# Patient Record
Sex: Female | Born: 1990 | Race: White | Hispanic: No | Marital: Married | Smoking: Former smoker
Health system: Southern US, Community
[De-identification: ages and names within clinical notes are randomized; demographics above are authoritative.]

## PROBLEM LIST (undated history)

## (undated) ENCOUNTER — Ambulatory Visit: Admission: EM | Payer: No Typology Code available for payment source | Source: Home / Self Care

## (undated) DIAGNOSIS — J45909 Unspecified asthma, uncomplicated: Secondary | ICD-10-CM

## (undated) DIAGNOSIS — G43909 Migraine, unspecified, not intractable, without status migrainosus: Secondary | ICD-10-CM

---
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
## 2011-06-23 LAB — POCT PREGNANCY, URINE

## 2011-09-25 ENCOUNTER — Emergency Department (INDEPENDENT_AMBULATORY_CARE_PROVIDER_SITE_OTHER)
Admission: EM | Admit: 2011-09-25 | Discharge: 2011-09-25 | Disposition: A | Discharge status: Home / Self Care | Payer: Worker's Compensation | Source: Home / Self Care | Attending: Emergency Medicine | Admitting: Emergency Medicine

## 2011-09-25 ENCOUNTER — Encounter: Payer: Self-pay | Admitting: Emergency Medicine

## 2011-09-25 DIAGNOSIS — S51809A Unspecified open wound of unspecified forearm, initial encounter: Secondary | ICD-10-CM

## 2011-09-25 DIAGNOSIS — S51819A Laceration without foreign body of unspecified forearm, initial encounter: Secondary | ICD-10-CM

## 2011-09-25 MED ORDER — IBUPROFEN 400 MG PO TABS: 400.0000 mg | ORAL_TABLET | Freq: Four times a day (QID) | ORAL | Status: AC | PRN

## 2011-09-25 NOTE — ED Notes (Signed)
Laceration to right forearm.  Injury occurred at work

## 2011-09-25 NOTE — ED Provider Notes (Addendum)
History     CSN: [id number]  Arrival date & time 09/25/11  1715   First MD Initiated Contact with Patient 09/25/11 1718      Chief Complaint  Patient presents with  . Laceration    (Consider location/radiation/quality/duration/timing/severity/associated sxs/prior treatment) Patient is a 21 y.o. female presenting with skin laceration. The history is provided by the patient.  Laceration  The incident occurred 1 to 2 hours ago. The laceration is located on the right arm. The laceration is 1 cm in size. The pain is at a severity of 5/10. The pain is moderate. The pain has been constant since onset. Her tetanus status is UTD.    History reviewed. No pertinent past medical history.  History reviewed. No pertinent past surgical history.  History reviewed. No pertinent family history.  History  Substance Use Topics  . Smoking status: Never Smoker   . Smokeless tobacco: Not on file  . Alcohol Use: No    OB History    Grav Para Term Preterm Abortions TAB SAB Ect Mult Living                  Review of Systems  Constitutional: Negative for activity change.  Musculoskeletal: Negative for joint swelling.    Allergies  Review of patient's allergies indicates no known allergies.  Home Medications   Current Outpatient Rx  Name Route Sig Dispense Refill  . IBUPROFEN 400 MG PO TABS Oral Take 1 tablet (400 mg total) by mouth every 6 (six) hours as needed for pain. 30 tablet 0    BP 130/86  Pulse 74  Temp(Src) 98.3 F (36.8 C) (Oral)  Resp 14  SpO2 97%  LMP 08/21/2011  Physical Exam  Constitutional: She appears well-developed and well-nourished.  Neurological: She is alert. She has normal strength. No sensory deficit.  Skin:       ED Course  LACERATION REPAIR Performed by: Hazelee Harbold Authorized by: Jimmie Molly Consent: Written consent obtained. Consent given by: patient Patient understanding: patient states understanding of the procedure being performed Patient  identity confirmed: verbally with patient Tendon involvement: none Nerve involvement: none Vascular damage: no Anesthesia: local infiltration Patient tolerance: Patient tolerated the procedure well with no immediate complications. Comments: approximated edges- and use derma bond-   (including critical care time)  Labs Reviewed - No data to display No results found.   1. Laceration of forearm       MDM  1.9 cm linear  laceration- superficial laceration        Jimmie Molly, MD 09/25/11 2016  Jimmie Molly, MD 09/25/11 2018

## 2011-09-25 NOTE — ED Notes (Addendum)
Applied steri strips

## 2011-11-07 ENCOUNTER — Encounter (HOSPITAL_COMMUNITY): Payer: Self-pay | Admitting: *Deleted

## 2011-11-07 ENCOUNTER — Emergency Department (HOSPITAL_COMMUNITY)
Admission: EM | Admit: 2011-11-07 | Discharge: 2011-11-07 | Disposition: A | Discharge status: Home / Self Care | Payer: Self-pay | Attending: Emergency Medicine | Admitting: Emergency Medicine

## 2011-11-07 DIAGNOSIS — R112 Nausea with vomiting, unspecified: Secondary | ICD-10-CM | POA: Insufficient documentation

## 2011-11-07 DIAGNOSIS — R109 Unspecified abdominal pain: Secondary | ICD-10-CM | POA: Insufficient documentation

## 2011-11-07 DIAGNOSIS — R319 Hematuria, unspecified: Secondary | ICD-10-CM | POA: Insufficient documentation

## 2011-11-07 DIAGNOSIS — R10819 Abdominal tenderness, unspecified site: Secondary | ICD-10-CM | POA: Insufficient documentation

## 2011-11-07 DIAGNOSIS — R6883 Chills (without fever): Secondary | ICD-10-CM | POA: Insufficient documentation

## 2011-11-07 DIAGNOSIS — R300 Dysuria: Secondary | ICD-10-CM | POA: Insufficient documentation

## 2011-11-07 DIAGNOSIS — N10 Acute tubulo-interstitial nephritis: Secondary | ICD-10-CM | POA: Insufficient documentation

## 2011-11-07 LAB — CBC
MCH: 29.2 pg (ref 26.0–34.0)
MCHC: 35.2 g/dL (ref 30.0–36.0)
Platelets: 144 10*3/uL — ABNORMAL LOW (ref 150–400)
RDW: 12 % (ref 11.5–15.5)

## 2011-11-07 LAB — URINALYSIS, ROUTINE W REFLEX MICROSCOPIC
Glucose, UA: NEGATIVE mg/dL
Ketones, ur: 40 mg/dL — AB
Nitrite: NEGATIVE
Protein, ur: 30 mg/dL — AB

## 2011-11-07 LAB — POCT I-STAT, CHEM 8
Chloride: 103 mEq/L (ref 96–112)
Glucose, Bld: 100 mg/dL — ABNORMAL HIGH (ref 70–99)
HCT: 38 % (ref 36.0–46.0)
Hemoglobin: 12.9 g/dL (ref 12.0–15.0)
Potassium: 4.1 mEq/L (ref 3.5–5.1)
Sodium: 138 mEq/L (ref 135–145)

## 2011-11-07 LAB — PREGNANCY, URINE: Preg Test, Ur: NEGATIVE

## 2011-11-07 LAB — DIFFERENTIAL
Basophils Absolute: 0 10*3/uL (ref 0.0–0.1)
Basophils Relative: 0 % (ref 0–1)
Eosinophils Absolute: 0 10*3/uL (ref 0.0–0.7)
Neutro Abs: 7.7 10*3/uL (ref 1.7–7.7)
Neutrophils Relative %: 75 % (ref 43–77)

## 2011-11-07 LAB — URINE MICROSCOPIC-ADD ON

## 2011-11-07 MED ORDER — SODIUM CHLORIDE 0.9 % IV BOLUS (SEPSIS)
1000.0000 mL | Freq: Once | INTRAVENOUS | Status: AC
  Administered 2011-11-07: 1000 mL via INTRAVENOUS

## 2011-11-07 MED ORDER — HYDROCODONE-ACETAMINOPHEN 5-500 MG PO TABS: 1.0000 | ORAL_TABLET | Freq: Four times a day (QID) | ORAL | Status: AC | PRN

## 2011-11-07 MED ORDER — KETOROLAC TROMETHAMINE 30 MG/ML IJ SOLN
30.0000 mg | Freq: Once | INTRAMUSCULAR | Status: AC
  Administered 2011-11-07: 30 mg via INTRAVENOUS
  Filled 2011-11-07: qty 1

## 2011-11-07 MED ORDER — DEXTROSE 5 % IV SOLN
1.0000 g | Freq: Once | INTRAVENOUS | Status: AC
  Administered 2011-11-07: 1 g via INTRAVENOUS
  Filled 2011-11-07: qty 10

## 2011-11-07 MED ORDER — ONDANSETRON 8 MG PO TBDP: 8.0000 mg | ORAL_TABLET | Freq: Three times a day (TID) | ORAL | Status: AC | PRN

## 2011-11-07 MED ORDER — CEPHALEXIN 500 MG PO CAPS: 1000.0000 mg | ORAL_CAPSULE | Freq: Two times a day (BID) | ORAL | Status: AC

## 2011-11-07 MED ORDER — ONDANSETRON HCL 4 MG/2ML IJ SOLN
4.0000 mg | Freq: Once | INTRAMUSCULAR | Status: AC
  Administered 2011-11-07: 4 mg via INTRAVENOUS
  Filled 2011-11-07: qty 2

## 2011-11-07 NOTE — Discharge Instructions (Signed)
I suspect your pain is due to an infection in left kidney. Make sure you get plenty of rest, drink lots of fluids. Start keflex as prescribed tonight, take it until all gone. Take zofran as prescribed for nausea as needed. Take ibuprofen for pain. Take vicodin as prescribed as needed for severe pain. Do not drive if taking. Follow up in 5 days if continue to have symptoms. Return if worsening.   Pyelonephritis, Adult Pyelonephritis is a kidney infection. In general, there are 2 main types of pyelonephritis:  Infections that come on quickly without any warning (acute pyelonephritis).   Infections that persist for a long period of time (chronic pyelonephritis).  CAUSES  Two main causes of pyelonephritis are:  Bacteria traveling from the bladder to the kidney. This is a problem especially in pregnant women. The urine in the bladder can become filled with bacteria from multiple causes, including:   Inflammation of the prostate gland (prostatitis).   Sexual intercourse in females.   Bladder infection (cystitis).   Bacteria traveling from the bloodstream to the tissue part of the kidney.  Problems that may increase your risk of getting a kidney infection include:  Diabetes.   Kidney stones or bladder stones.   Cancer.   Catheters placed in the bladder.   Other abnormalities of the kidney or ureter.  SYMPTOMS   Abdominal pain.   Pain in the side or flank area.   Fever.   Chills.   Upset stomach.   Blood in the urine (dark urine).   Frequent urination.   Strong or persistent urge to urinate.   Burning or stinging when urinating.  DIAGNOSIS  Your caregiver may diagnose your kidney infection based on your symptoms. A urine sample may also be taken. TREATMENT  In general, treatment depends on how severe the infection is.   If the infection is mild and caught early, your caregiver may treat you with oral antibiotics and send you home.   If the infection is more severe,  the bacteria may have gotten into the bloodstream. This will require intravenous (IV) antibiotics and a hospital stay. Symptoms may include:   High fever.   Severe flank pain.   Shaking chills.   Even after a hospital stay, your caregiver may require you to be on oral antibiotics for a period of time.   Other treatments may be required depending upon the cause of the infection.  HOME CARE INSTRUCTIONS   Take your antibiotics as directed. Finish them even if you start to feel better.   Make an appointment to have your urine checked to make sure the infection is gone.   Drink enough fluids to keep your urine clear or pale yellow.   Take medicines for the bladder if you have urgency and frequency of urination as directed by your caregiver.  SEEK IMMEDIATE MEDICAL CARE IF:   You have a fever.   You are unable to take your antibiotics or fluids.   You develop shaking chills.   You experience extreme weakness or fainting.   There is no improvement after 2 days of treatment.  MAKE SURE YOU:  Understand these instructions.   Will watch your condition.   Will get help right away if you are not doing well or get worse.  Document Released: 09/06/2005 Document Revised: 05/19/2011 Document Reviewed: 02/10/2011 [facility] Patient Information 2012 [place], [place].

## 2011-11-07 NOTE — ED Provider Notes (Signed)
History     CSN: [id number]  Arrival date & time 11/07/11  0919   First MD Initiated Contact with Patient 11/07/11 0935      Chief Complaint  Patient presents with  . Urinary Tract Infection    (Consider location/radiation/quality/duration/timing/severity/associated sxs/prior treatment) Patient is a 21 y.o. female presenting with flank pain. The history is provided by the patient.  Flank Pain This is a new problem. The current episode started in the past 7 days. The problem occurs constantly. The problem has been gradually worsening. Associated symptoms include abdominal pain, chills, nausea, urinary symptoms and vomiting. Pertinent negatives include no change in bowel habit or fever.  Pt states her symptoms began with lower abdominal pain about 4 days ago. States she thought it maybe was ovarian cysts, states waited it out. Yesterday developed left flank pain, nausea, vomiting. Denies fever. Admits to chills. States also noted blood in her urine. Took ibuprofen last night with some relief. Denies vaginal pain, discharge, bleeding.   No past medical history on file.  No past surgical history on file.  No family history on file.  History  Substance Use Topics  . Smoking status: Never Smoker   . Smokeless tobacco: Not on file  . Alcohol Use: No    OB History    Grav Para Term Preterm Abortions TAB SAB Ect Mult Living                  Review of Systems  Constitutional: Positive for chills. Negative for fever.  HENT: Negative.   Eyes: Negative.   Respiratory: Negative.   Cardiovascular: Negative.   Gastrointestinal: Positive for nausea, vomiting and abdominal pain. Negative for diarrhea, constipation and change in bowel habit.  Genitourinary: Positive for dysuria, hematuria and flank pain. Negative for vaginal bleeding, vaginal discharge and vaginal pain.  Musculoskeletal: Negative.   Skin: Negative.   Neurological: Negative.   Psychiatric/Behavioral: Negative.      Allergies  Review of patient's allergies indicates no known allergies.  Home Medications   Current Outpatient Rx  Name Route Sig Dispense Refill  . IBUPROFEN 200 MG PO TABS Oral Take 400 mg by mouth every 8 (eight) hours as needed. For pain.      BP 128/68  Pulse 82  Temp(Src) 98.8 F (37.1 C) (Oral)  Resp 16  SpO2 98%  Physical Exam  Nursing note and vitals reviewed. Constitutional: She is oriented to person, place, and time. She appears well-developed and well-nourished. No distress.  HENT:  Head: Normocephalic and atraumatic.  Eyes: Conjunctivae are normal.  Neck: Neck supple.  Cardiovascular: Normal rate, regular rhythm and normal heart sounds.   Pulmonary/Chest: Effort normal and breath sounds normal. No respiratory distress.  Abdominal: Soft. Bowel sounds are normal. There is no rebound and no guarding.       LLQ and LUQ tenderness. Left CVA tenderness  Neurological: She is alert and oriented to person, place, and time.  Skin: Skin is warm and dry. No rash noted.  Psychiatric: She has a normal mood and affect.    ED Course  Procedures (including critical care time)  Results for orders placed during the hospital encounter of 11/07/11  URINALYSIS, ROUTINE W REFLEX MICROSCOPIC      Component Value Range   Color, Urine YELLOW  YELLOW    APPearance CLOUDY (*) CLEAR    Specific Gravity, Urine 1.014  1.005 - 1.030    pH 5.5  5.0 - 8.0    Glucose, UA NEGATIVE  NEGATIVE (mg/dL)   Hgb urine dipstick LARGE (*) NEGATIVE    Bilirubin Urine NEGATIVE  NEGATIVE    Ketones, ur 40 (*) NEGATIVE (mg/dL)   Protein, ur 30 (*) NEGATIVE (mg/dL)   Urobilinogen, UA 0.2  0.0 - 1.0 (mg/dL)   Nitrite NEGATIVE  NEGATIVE    Leukocytes, UA MODERATE (*) NEGATIVE   PREGNANCY, URINE      Component Value Range   Preg Test, Ur NEGATIVE  NEGATIVE   CBC      Component Value Range   WBC 10.2  4.0 - 10.5 (K/uL)   RBC 4.48  3.87 - 5.11 (MIL/uL)   Hemoglobin 13.1  12.0 - 15.0 (g/dL)   HCT  69.6  29.5 - 28.4 (%)   MCV 83.0  78.0 - 100.0 (fL)   MCH 29.2  26.0 - 34.0 (pg)   MCHC 35.2  30.0 - 36.0 (g/dL)   RDW 13.2  44.0 - 10.2 (%)   Platelets 144 (*) 150 - 400 (K/uL)  DIFFERENTIAL      Component Value Range   Neutrophils Relative 75  43 - 77 (%)   Neutro Abs 7.7  1.7 - 7.7 (K/uL)   Lymphocytes Relative 10 (*) 12 - 46 (%)   Lymphs Abs 1.0  0.7 - 4.0 (K/uL)   Monocytes Relative 14 (*) 3 - 12 (%)   Monocytes Absolute 1.5 (*) 0.1 - 1.0 (K/uL)   Eosinophils Relative 0  0 - 5 (%)   Eosinophils Absolute 0.0  0.0 - 0.7 (K/uL)   Basophils Relative 0  0 - 1 (%)   Basophils Absolute 0.0  0.0 - 0.1 (K/uL)  POCT I-STAT, CHEM 8      Component Value Range   Sodium 138  135 - 145 (mEq/L)   Potassium 4.1  3.5 - 5.1 (mEq/L)   Chloride 103  96 - 112 (mEq/L)   BUN 6  6 - 23 (mg/dL)   Creatinine, Ser 7.25  0.50 - 1.10 (mg/dL)   Glucose, Bld 366 (*) 70 - 99 (mg/dL)   Calcium, Ion 4.40  3.47 - 1.32 (mmol/L)   TCO2 23  0 - 100 (mmol/L)   Hemoglobin 12.9  12.0 - 15.0 (g/dL)   HCT 42.5  95.6 - 38.7 (%)  URINE MICROSCOPIC-ADD ON      Component Value Range   Squamous Epithelial / LPF RARE  RARE    WBC, UA 21-50  <3 (WBC/hpf)   RBC / HPF 21-50  <3 (RBC/hpf)   Bacteria, UA MANY (*) RARE    No results found.  UA infected. Given pt's left CVA tenderness and pain, with nausea/vomiting, suspect pyelonephritis. PT afebrile, no elevated WBC. Not vomiting in ED. Rehydrated with IV NS. Zofran, toradol for pain. Rocephin given for infection, 1g IVPB. Will start on keflex, cultures sent. Pt non toxic, VS normal. Normal renal function. Not pregnant. PT denies any vaginal symptoms.  I think outpatient therapy is appropriate. Will d/c home. Instructed to return if worsening.   No diagnosis found.    MDM          Lottie Mussel, PA 11/07/11 1226

## 2011-11-07 NOTE — ED Notes (Signed)
Pt states 2-3 days ago started having L lower abdominal pain, now pain is in L lower back. States her mom said to come to ER b/c could be kidney infection. Pt states has noticed some blood in urine. Pt states burns at times and feels like she has to urinate frequently.

## 2011-11-08 NOTE — ED Provider Notes (Signed)
Medical screening examination/treatment/procedure(s) were performed by non-physician practitioner and as supervising physician I was immediately available for consultation/collaboration.  Matteson Blue M Stacee Earp, MD 11/08/11 2201 

## 2011-11-09 LAB — URINE CULTURE: Culture  Setup Time: 201302171756

## 2011-11-10 NOTE — ED Notes (Signed)
+   Urine Patient treated with Keflex-sensitive to same-chart appended per protocol MD. 

## 2012-05-01 ENCOUNTER — Encounter (HOSPITAL_COMMUNITY): Payer: Self-pay | Admitting: Emergency Medicine

## 2012-05-01 ENCOUNTER — Emergency Department (HOSPITAL_COMMUNITY)
Admission: EM | Admit: 2012-05-01 | Discharge: 2012-05-01 | Disposition: A | Discharge status: Home / Self Care | Payer: Self-pay | Attending: Emergency Medicine | Admitting: Emergency Medicine

## 2012-05-01 DIAGNOSIS — M549 Dorsalgia, unspecified: Secondary | ICD-10-CM

## 2012-05-01 DIAGNOSIS — M545 Low back pain, unspecified: Secondary | ICD-10-CM | POA: Insufficient documentation

## 2012-05-01 HISTORY — DX: Migraine, unspecified, not intractable, without status migrainosus: G43.909

## 2012-05-01 MED ORDER — HYDROCODONE-ACETAMINOPHEN 5-325 MG PO TABS
1.0000 | ORAL_TABLET | Freq: Once | ORAL | Status: AC
  Administered 2012-05-01: 1 via ORAL
  Filled 2012-05-01: qty 1

## 2012-05-01 MED ORDER — HYDROCODONE-ACETAMINOPHEN 5-500 MG PO TABS: 1.0000 | ORAL_TABLET | Freq: Four times a day (QID) | ORAL | Status: AC | PRN

## 2012-05-01 MED ORDER — CYCLOBENZAPRINE HCL 10 MG PO TABS: 10.0000 mg | ORAL_TABLET | Freq: Two times a day (BID) | ORAL | Status: AC | PRN

## 2012-05-01 MED ORDER — IBUPROFEN 600 MG PO TABS: 600.0000 mg | ORAL_TABLET | Freq: Four times a day (QID) | ORAL | Status: AC | PRN

## 2012-05-01 NOTE — ED Provider Notes (Signed)
History     CSN: [id number]  Arrival date & time 05/01/12  1332   First MD Initiated Contact with Patient 05/01/12 1515      Chief Complaint  Patient presents with  . Back Pain    (Consider location/radiation/quality/duration/timing/severity/associated sxs/prior treatment) HPI  Patient presents to the ER with complaints of low back soreness after riding a mechanical bull on Saturday. She states that she had mild low back pain for a month previously because she worked at [employer]. She states that she has multiple bruises to her inner thighs as well. She denies having any numbness of tingling in her extremities. She denies being unable to walk. She says that she didn't think he back pain was bad enough to  Come to the ER but her dad was concerned because she was diagnosed with mild scoliosis as a infant. She denies IV drug use. She d enies fevers, chills and weakness.  Past Medical History  Diagnosis Date  . Migraine     History reviewed. No pertinent past surgical history.  Family History  Problem Relation Age of Onset  . Migraines Mother     History  Substance Use Topics  . Smoking status: Never Smoker   . Smokeless tobacco: Not on file  . Alcohol Use: No    OB History    Grav Para Term Preterm Abortions TAB SAB Ect Mult Living                  Review of Systems   HEENT: denies blurry vision or change in hearing PULMONARY: Denies difficulty breathing and SOB CARDIAC: denies chest pain or heart palpitations MUSCULOSKELETAL:  denies being unable to ambulate ABDOMEN AL: denies abdominal pain GU: denies loss of bowel or urinary control NEURO: denies numbness and tingling in extremities SKIN: no new rashes PSYCH: patient denies anxiety or depression. NECK: Pt denies having neck pain     Allergies  Review of patient's allergies indicates no known allergies.  Home Medications   Current Outpatient Rx  Name Route Sig Dispense Refill  . IBUPROFEN  200 MG PO TABS Oral Take 400 mg by mouth every 8 (eight) hours as needed. For pain.    . CYCLOBENZAPRINE HCL 10 MG PO TABS Oral Take 1 tablet (10 mg total) by mouth 2 (two) times daily as needed for muscle spasms. 20 tablet 0  . HYDROCODONE-ACETAMINOPHEN 5-500 MG PO TABS Oral Take 1-2 tablets by mouth every 6 (six) hours as needed for pain. 15 tablet 0  . IBUPROFEN 600 MG PO TABS Oral Take 1 tablet (600 mg total) by mouth every 6 (six) hours as needed for pain. 30 tablet 0    BP 131/83  Pulse 84  Temp 98.4 F (36.9 C) (Oral)  Resp 20  SpO2 100%  LMP 03/31/2012  Physical Exam  Nursing note and vitals reviewed. Constitutional: She appears well-developed and well-nourished. No distress.  HENT:  Head: Normocephalic and atraumatic.  Eyes: Pupils are equal, round, and reactive to light.  Neck: Normal range of motion. Neck supple.  Cardiovascular: Normal rate and regular rhythm.   Pulmonary/Chest: Effort normal.  Abdominal: Soft.  Musculoskeletal:       Back:        Equal strength to bilateral lower extremities. Neurosensory  function adequate to both legs. Skin color is normal. Skin is warm and moist. I see no step off deformity, no bony tenderness. Pt is able to ambulate without limp. Pain is relieved when sitting in  certain positions. ROM is decreased due to pain. No crepitus, laceration, effusion, swelling.  Pulses are normal   Neurological: She is alert.  Skin: Skin is warm and dry.    ED Course  Procedures (including critical care time)  Labs Reviewed - No data to display No results found.   1. Back pain       MDM  Patient with back pain. No neurological deficits. Patient is ambulatory. No warning symptoms of back pain including: loss of bowel or bladder control, night sweats, waking from sleep with back pain, unexplained fevers or weight loss, h/o cancer, IVDU, recent trauma. No concern for cauda equina, epidural abscess, or other serious cause of back pain.  Conservative measures such as rest, ice/heat and pain medicine indicated with PCP follow-up if no improvement with conservative management.   Pt has been advised of the symptoms that warrant their return to the ED. Patient has voiced understanding and has agreed to follow-up with the PCP or specialist.          Dorthula Matas, PA 05/01/12 1534

## 2012-05-01 NOTE — ED Notes (Signed)
Pt reports one month hx of low back pain. Was riding mechanical bull on Saturday. Back Pain increased, multiple bruises noted on inner thighs

## 2012-05-02 NOTE — ED Provider Notes (Signed)
Medical screening examination/treatment/procedure(s) were performed by non-physician practitioner and as supervising physician I was immediately available for consultation/collaboration.   Chemika Nightengale M Bertis Hustead, DO 05/02/12 1533 

## 2012-06-19 ENCOUNTER — Encounter (HOSPITAL_COMMUNITY): Payer: Self-pay | Admitting: *Deleted

## 2012-06-19 ENCOUNTER — Emergency Department (HOSPITAL_COMMUNITY)
Admission: EM | Admit: 2012-06-19 | Discharge: 2012-06-19 | Disposition: A | Discharge status: Home / Self Care | Payer: Self-pay | Attending: Emergency Medicine | Admitting: Emergency Medicine

## 2012-06-19 DIAGNOSIS — J45909 Unspecified asthma, uncomplicated: Secondary | ICD-10-CM | POA: Insufficient documentation

## 2012-06-19 DIAGNOSIS — G43909 Migraine, unspecified, not intractable, without status migrainosus: Secondary | ICD-10-CM | POA: Insufficient documentation

## 2012-06-19 HISTORY — DX: Unspecified asthma, uncomplicated: J45.909

## 2012-06-19 MED ORDER — KETOROLAC TROMETHAMINE 30 MG/ML IJ SOLN
30.0000 mg | Freq: Once | INTRAMUSCULAR | Status: AC
  Administered 2012-06-19: 30 mg via INTRAVENOUS
  Filled 2012-06-19: qty 1

## 2012-06-19 MED ORDER — DIPHENHYDRAMINE HCL 50 MG/ML IJ SOLN
12.5000 mg | Freq: Once | INTRAMUSCULAR | Status: AC
  Administered 2012-06-19: 12.5 mg via INTRAVENOUS
  Filled 2012-06-19 (×2): qty 1

## 2012-06-19 MED ORDER — DEXAMETHASONE SODIUM PHOSPHATE 10 MG/ML IJ SOLN
10.0000 mg | Freq: Once | INTRAMUSCULAR | Status: AC
  Administered 2012-06-19: 10 mg via INTRAVENOUS
  Filled 2012-06-19: qty 1

## 2012-06-19 MED ORDER — METOCLOPRAMIDE HCL 5 MG/ML IJ SOLN
10.0000 mg | Freq: Once | INTRAMUSCULAR | Status: AC
  Administered 2012-06-19: 10 mg via INTRAVENOUS
  Filled 2012-06-19: qty 2

## 2012-06-19 MED ORDER — SODIUM CHLORIDE 0.9 % IV BOLUS (SEPSIS)
1000.0000 mL | Freq: Once | INTRAVENOUS | Status: AC
  Administered 2012-06-19: 1000 mL via INTRAVENOUS

## 2012-06-19 NOTE — ED Provider Notes (Signed)
History     CSN: [id number]  Arrival date & time 06/19/12  1121   First MD Initiated Contact with Patient 06/19/12 1153      Chief Complaint  Patient presents with  . Migraine    (Consider location/radiation/quality/duration/timing/severity/associated sxs/prior treatment) HPI  21 year old female with history of migraine headache. Presents complaining of headache. Patient reports a gradual onset of pain to the left side of her for the past 3 days. Headache initially started when she was cleaning her house with bleach, in an enclosed room. Describes pain as sharp and throbbing sensation, usually resides the left eye. She endorsed photophobia, phonophobia, blurry vision, nausea, occasional vomiting and tingling sensation to tip of L fingers/hand.  Sxs felt similar to prior migraine.  Sxs make worse when she was hit in the face with a hand while playing soccer 2 days ago.  She denies LOC.  She has tried OTC meds without relief.  She is not pregnant due to sexual preference.  Denies fever, stiff neck, cp, sob, abd pain or rash. No tick exposure.    Past Medical History  Diagnosis Date  . Migraine   . Asthma     History reviewed. No pertinent past surgical history.  Family History  Problem Relation Age of Onset  . Migraines Mother     History  Substance Use Topics  . Smoking status: Never Smoker   . Smokeless tobacco: Not on file  . Alcohol Use: No    OB History    Grav Para Term Preterm Abortions TAB SAB Ect Mult Living                  Review of Systems  All other systems reviewed and are negative.    Allergies  Review of patient's allergies indicates no known allergies.  Home Medications   Current Outpatient Rx  Name Route Sig Dispense Refill  . ACETAMINOPHEN 325 MG PO TABS Oral Take 650 mg by mouth every 6 (six) hours as needed. For headache    . ASPIRIN-ACETAMINOPHEN-CAFFEINE 250-250-65 MG PO TABS Oral Take 1 tablet by mouth every 6 (six) hours as needed. For  headache    . IBUPROFEN 200 MG PO TABS Oral Take 400 mg by mouth every 8 (eight) hours as needed. For headache      BP 139/89  Pulse 91  Temp 98.1 F (36.7 C) (Oral)  Resp 20  Ht 5' (1.524 m)  Wt 103 lb 3.2 oz (46.811 kg)  BMI 20.15 kg/m2  SpO2 100%  LMP 05/27/2012  Physical Exam  Nursing note and vitals reviewed. Constitutional: She appears well-developed and well-nourished. No distress.       Awake, alert, nontoxic appearance  HENT:  Head: Atraumatic.  Eyes: Conjunctivae normal and EOM are normal. Pupils are equal, round, and reactive to light. Right eye exhibits no discharge. Left eye exhibits no discharge.  Neck: Neck supple. No Brudzinski's sign and no Kernig's sign noted.  Cardiovascular: Normal rate and regular rhythm.   Pulmonary/Chest: Effort normal. No respiratory distress. She exhibits no tenderness.  Abdominal: Soft. There is no tenderness. There is no rebound.  Musculoskeletal: She exhibits no tenderness.       ROM appears intact, no obvious focal weakness  Lymphadenopathy:    She has no cervical adenopathy.  Neurological: She has normal strength. Coordination and gait normal. GCS eye subscore is 4. GCS verbal subscore is 5. GCS motor subscore is 6.       Mental status and motor strength  appears intact  Skin: No rash noted.  Psychiatric: She has a normal mood and affect.    ED Course  Procedures (including critical care time)  Labs Reviewed - No data to display No results found.   No diagnosis found.  1. migraine  MDM  Migraine headache with out red flags finding.  Will give migraine cocktail.  Will also check visual acuity.  Her VSS, afebrile.     2:02 PM  improvement after migraine cocktail. Pt requested to be discharge. She is stable to be discharged at this time. Resource given for further follow up.      BP 139/89  Pulse 91  Temp 98.1 F (36.7 C) (Oral)  Resp 20  Ht 5' (1.524 m)  Wt 103 lb 3.2 oz (46.811 kg)  BMI 20.15 kg/m2  SpO2 100%   LMP 05/27/2012  Nursing notes reviewed and considered in documentation  Previous records reviewed and considered      Fayrene Helper, PA-C 06/19/12 1403

## 2012-06-19 NOTE — ED Notes (Signed)
Pt reports migraine since Friday. States has hx of same but no relief with home medication. States usually excedrin migraine relieves that pain. Also reports cleaning with bleach in a closed room on Friday, being hit in face with soccer ball on Saturday. Also reports n/v.

## 2012-06-19 NOTE — ED Notes (Signed)
Bruised below left eye was noted and right elbow. Pt reports bruise resulted from playing soccer at school.

## 2012-06-22 NOTE — ED Provider Notes (Signed)
Medical screening examination/treatment/procedure(s) were performed by non-physician practitioner and as supervising physician I was immediately available for consultation/collaboration.  Riku Buttery, MD 06/22/12 1059 

## 2012-08-03 ENCOUNTER — Emergency Department (HOSPITAL_COMMUNITY)
Admission: EM | Admit: 2012-08-03 | Discharge: 2012-08-03 | Disposition: A | Discharge status: Home / Self Care | Payer: Self-pay | Attending: Emergency Medicine | Admitting: Emergency Medicine

## 2012-08-03 ENCOUNTER — Emergency Department (HOSPITAL_COMMUNITY): Payer: Self-pay

## 2012-08-03 DIAGNOSIS — J029 Acute pharyngitis, unspecified: Secondary | ICD-10-CM | POA: Insufficient documentation

## 2012-08-03 DIAGNOSIS — R0602 Shortness of breath: Secondary | ICD-10-CM | POA: Insufficient documentation

## 2012-08-03 DIAGNOSIS — R1110 Vomiting, unspecified: Secondary | ICD-10-CM | POA: Insufficient documentation

## 2012-08-03 DIAGNOSIS — J3489 Other specified disorders of nose and nasal sinuses: Secondary | ICD-10-CM | POA: Insufficient documentation

## 2012-08-03 DIAGNOSIS — IMO0001 Reserved for inherently not codable concepts without codable children: Secondary | ICD-10-CM | POA: Insufficient documentation

## 2012-08-03 DIAGNOSIS — B9789 Other viral agents as the cause of diseases classified elsewhere: Secondary | ICD-10-CM

## 2012-08-03 DIAGNOSIS — Z8709 Personal history of other diseases of the respiratory system: Secondary | ICD-10-CM | POA: Insufficient documentation

## 2012-08-03 DIAGNOSIS — R05 Cough: Secondary | ICD-10-CM | POA: Insufficient documentation

## 2012-08-03 DIAGNOSIS — R059 Cough, unspecified: Secondary | ICD-10-CM | POA: Insufficient documentation

## 2012-08-03 MED ORDER — ALBUTEROL SULFATE (5 MG/ML) 0.5% IN NEBU
5.0000 mg | INHALATION_SOLUTION | Freq: Once | RESPIRATORY_TRACT | Status: AC
  Administered 2012-08-03: 5 mg via RESPIRATORY_TRACT
  Filled 2012-08-03 (×2): qty 0.5

## 2012-08-03 MED ORDER — HYDROCOD POLST-CHLORPHEN POLST 10-8 MG/5ML PO LQCR
5.0000 mL | Freq: Once | ORAL | Status: AC
  Administered 2012-08-03: 5 mL via ORAL
  Filled 2012-08-03: qty 5

## 2012-08-03 MED ORDER — ALBUTEROL SULFATE HFA 108 (90 BASE) MCG/ACT IN AERS: 1.0000 | INHALATION_SPRAY | Freq: Four times a day (QID) | RESPIRATORY_TRACT | Status: DC | PRN

## 2012-08-03 MED ORDER — IPRATROPIUM BROMIDE 0.02 % IN SOLN
0.5000 mg | Freq: Once | RESPIRATORY_TRACT | Status: AC
  Administered 2012-08-03: 0.5 mg via RESPIRATORY_TRACT
  Filled 2012-08-03: qty 2.5

## 2012-08-03 MED ORDER — HYDROCOD POLST-CHLORPHEN POLST 10-8 MG/5ML PO LQCR: 5.0000 mL | Freq: Two times a day (BID) | ORAL | Status: DC | PRN

## 2012-08-03 NOTE — ED Provider Notes (Signed)
Medical screening examination/treatment/procedure(s) were performed by non-physician practitioner and as supervising physician I was immediately available for consultation/collaboration.   Obed Samek M Becker Christopher, MD 08/03/12 2258 

## 2012-08-03 NOTE — ED Provider Notes (Signed)
History     CSN: [id number]  Arrival date & time 08/03/12  1519   First MD Initiated Contact with Patient 08/03/12 1600      No chief complaint on file.   (Consider location/radiation/quality/duration/timing/severity/associated sxs/prior treatment) HPI Comments: Patient reports she has been sick for 7 days with SOB, cough productive of yellow/green sputum, myalgias, sore throat, nasal congestion, and rhinorrhea.  Coughing and SOB are keeping her awake at night.  Several episodes of post-tussive emesis.  Has taken mucinex, Nyquil, and ibuprofen with only temporarily relief.  Pt does have a history of asthma, is out of her albuterol.  No known sick contacts.   The history is provided by the patient and a friend.    Past Medical History  Diagnosis Date  . Migraine   . Asthma     No past surgical history on file.  Family History  Problem Relation Age of Onset  . Migraines Mother     History  Substance Use Topics  . Smoking status: Never Smoker   . Smokeless tobacco: Not on file  . Alcohol Use: No    OB History    Grav Para Term Preterm Abortions TAB SAB Ect Mult Living                  Review of Systems  Constitutional: Negative for fever and chills.  HENT: Positive for congestion, sore throat, rhinorrhea and sinus pressure. Negative for trouble swallowing.   Respiratory: Positive for cough and shortness of breath.   Cardiovascular: Negative for chest pain.  Gastrointestinal: Positive for vomiting.  Musculoskeletal: Positive for myalgias.    Allergies  Review of patient's allergies indicates no known allergies.  Home Medications   Current Outpatient Rx  Name  Route  Sig  Dispense  Refill  . GUAIFENESIN ER 600 MG PO TB12   Oral   Take 1,200 mg by mouth 2 (two) times daily.         . IBUPROFEN 200 MG PO TABS   Oral   Take 400 mg by mouth every 8 (eight) hours as needed. For headache         . PSEUDOEPHEDRINE-APAP-DM 60-650-20 MG/30ML PO LIQD    Oral   Take 30 mLs by mouth as needed. Congestion           There were no vitals taken for this visit.  Physical Exam  Nursing note and vitals reviewed. Constitutional: She appears well-developed and well-nourished. No distress.  HENT:  Head: Normocephalic and atraumatic.  Nose: Right sinus exhibits maxillary sinus tenderness and frontal sinus tenderness. Left sinus exhibits maxillary sinus tenderness and frontal sinus tenderness.  Mouth/Throat: Uvula is midline and oropharynx is clear and moist. No uvula swelling. No oropharyngeal exudate, posterior oropharyngeal edema, posterior oropharyngeal erythema or tonsillar abscesses.  Eyes: Conjunctivae normal are normal.  Neck: Neck supple.  Cardiovascular: Normal rate and regular rhythm.   Pulmonary/Chest: Effort normal. No stridor. No respiratory distress. She has wheezes. She has no rales.       Diffuse loud expiratory wheezes.   Lymphadenopathy:    She has cervical adenopathy.  Neurological: She is alert.  Skin: She is not diaphoretic.    ED Course  Procedures (including critical care time)  Labs Reviewed - No data to display Dg Chest 2 View  08/03/2012  *RADIOLOGY REPORT*  Clinical Data: Cough, wheezing, myalgias, history of asthma  CHEST - 2 VIEW  Comparison:  None.  Findings:  The heart size and mediastinal contours  are within normal limits.  Both lungs are clear.  The visualized skeletal structures are unremarkable.  IMPRESSION: No active cardiopulmonary disease.   Original Report Authenticated By: Judie Petit. Miles Costain, M.D.     5:20 PM Patient moving air well now in all fields.  Continued but improved wheezing throughout.  Pt declines further neb treatments.    1. Viral respiratory illness     MDM  Pt with hx asthma with 7 days of respiratory symptoms including myalgias, SOB.  Pt with loud expiratory wheezes on exam.  She is afebrile here but ill-appearing.  Treated in ED with nebs, tussionex.  CXR normal.  Pt improved with neb  treatment and tussionex.  Pt d/c home with albuterol and tussionex.  Likely viral/flu-like illness, likely exacerbated by patient's hx asthma (reported well-controlled without need for inhalers in general).  Discussed all results, treatment plan with patient.  Pt given return precautions.  Pt verbalizes understanding and agrees with plan.           [place], [place] 08/03/12 1850

## 2012-08-03 NOTE — ED Notes (Signed)
Pt present with c/o "flu like symptoms" x 1 week.  Pt reports chills, coughing, congested and chest pain.   Pt reports "aching all over"  Pt c/o HA and spitting up greenish/yellow mucous

## 2012-08-03 NOTE — ED Notes (Signed)
Pt verbalizes understanding 

## 2014-04-13 ENCOUNTER — Emergency Department (HOSPITAL_BASED_OUTPATIENT_CLINIC_OR_DEPARTMENT_OTHER): Payer: Self-pay

## 2014-04-13 ENCOUNTER — Emergency Department (HOSPITAL_BASED_OUTPATIENT_CLINIC_OR_DEPARTMENT_OTHER)
Admission: EM | Admit: 2014-04-13 | Discharge: 2014-04-14 | Disposition: A | Discharge status: Home / Self Care | Payer: Self-pay | Attending: Emergency Medicine | Admitting: Emergency Medicine

## 2014-04-13 DIAGNOSIS — J209 Acute bronchitis, unspecified: Secondary | ICD-10-CM

## 2014-04-13 DIAGNOSIS — J45901 Unspecified asthma with (acute) exacerbation: Secondary | ICD-10-CM | POA: Insufficient documentation

## 2014-04-13 DIAGNOSIS — Z79899 Other long term (current) drug therapy: Secondary | ICD-10-CM | POA: Insufficient documentation

## 2014-04-13 DIAGNOSIS — Z8679 Personal history of other diseases of the circulatory system: Secondary | ICD-10-CM | POA: Insufficient documentation

## 2014-04-13 DIAGNOSIS — Z87891 Personal history of nicotine dependence: Secondary | ICD-10-CM | POA: Insufficient documentation

## 2014-04-13 DIAGNOSIS — R0602 Shortness of breath: Secondary | ICD-10-CM | POA: Insufficient documentation

## 2014-04-13 MED ORDER — METHYLPREDNISOLONE SODIUM SUCC 125 MG IJ SOLR
125.0000 mg | Freq: Once | INTRAMUSCULAR | Status: AC
  Administered 2014-04-13: 125 mg via INTRAVENOUS
  Filled 2014-04-13: qty 2

## 2014-04-13 MED ORDER — IPRATROPIUM-ALBUTEROL 0.5-2.5 (3) MG/3ML IN SOLN
3.0000 mL | Freq: Once | RESPIRATORY_TRACT | Status: AC
  Administered 2014-04-13: 3 mL via RESPIRATORY_TRACT
  Filled 2014-04-13: qty 3

## 2014-04-13 MED ORDER — ALBUTEROL (5 MG/ML) CONTINUOUS INHALATION SOLN
10.0000 mg/h | INHALATION_SOLUTION | RESPIRATORY_TRACT | Status: AC
  Administered 2014-04-14: 10 mg/h via RESPIRATORY_TRACT
  Filled 2014-04-13: qty 20

## 2014-04-13 MED ORDER — ALBUTEROL SULFATE (2.5 MG/3ML) 0.083% IN NEBU
2.5000 mg | INHALATION_SOLUTION | Freq: Once | RESPIRATORY_TRACT | Status: AC
  Administered 2014-04-13: 2.5 mg via RESPIRATORY_TRACT
  Filled 2014-04-13: qty 3

## 2014-04-13 NOTE — ED Provider Notes (Addendum)
CSN: [id number]     Arrival date & time 04/13/14  2315 History  This chart was scribed for Hanley SeamenJohn L Janese Radabaugh, MD, by Yevette EdwardsAngela Bracken, ED Scribe. This patient was seen in room MH01/MH01 and the patient's care was started at 11:31 PM.   None    Chief Complaint  Patient presents with  . Shortness of Breath   The history is provided by the patient. No language interpreter was used.   HPI Comments: Bonnie Mills is a 23 y.o. female, with a remote h/o asthma, who presents to the Emergency Department complaining of gradual-onset SOB which acutely worsened this evening. She was exposed to a sick contact who has a URI, and she developed similar symptoms (fever, malaise, nasal congestion, sore throat, cough and chest soreness) two days ago.The pt had a fever yesterday and today with a maximum temperature of 101 F, treated with acetaminophen with improvement. She denies nausea, vomiting, diarrhea or dysuria. The pt's spouse reports the pt has had a chronic cough for approximately two months. The pt does not currently possess an albuterol inhaler; she has not had an asthma attack in 12 years. She recently stopped smoking cigarrettes.   Past Medical History  Diagnosis Date  . Migraine   . Asthma    No past surgical history on file. Family History  Problem Relation Age of Onset  . Migraines Mother    History  Substance Use Topics  . Smoking status: Never Smoker   . Smokeless tobacco: Not on file  . Alcohol Use: No   No OB history provided.  Review of Systems  A complete 10 system review of systems was obtained, and all systems were negative except where indicated in the HPI and PE.    Allergies  Review of patient's allergies indicates no known allergies.  Home Medications   Prior to Admission medications   Medication Sig Start Date End Date Taking? Authorizing Provider  albuterol (PROVENTIL HFA;VENTOLIN HFA) 108 (90 BASE) MCG/ACT inhaler Inhale 1-2 puffs into the lungs every 6 (six) hours as  needed for wheezing. 08/03/12   Trixie DredgeEmily West, PA-C  chlorpheniramine-HYDROcodone (TUSSIONEX PENNKINETIC ER) 10-8 MG/5ML LQCR Take 5 mLs by mouth every 12 (twelve) hours as needed (cough, pain). 08/03/12   Trixie DredgeEmily West, PA-C  guaiFENesin (MUCINEX) 600 MG 12 hr tablet Take 1,200 mg by mouth 2 (two) times daily.    Historical Provider, MD  ibuprofen (ADVIL,MOTRIN) 200 MG tablet Take 400 mg by mouth every 8 (eight) hours as needed. For headache    Historical Provider, MD  Pseudoephedrine-APAP-DM (DAYQUIL MULTI-SYMPTO) [phone]) 60-650-20 MG/30ML LIQD Take 30 mLs by mouth as needed. Congestion    Historical Provider, MD   Triage Vitals:  BP 142/98  Pulse 99  Temp(Src) 98.7 F (37.1 C) (Oral)  Resp 20  Ht 4\' 11"  (1.499 m)  Wt 100 lb (45.36 kg)  BMI 20.19 kg/m2  SpO2 97%  Physical Exam  Nursing note and vitals reviewed.   General: Well-developed, well-nourished female in no acute distress; appearance consistent with age of record HENT: normocephalic; atraumatic; TMs normal; no pharyngeal erythema, edema, or exudate Eyes: pupils equal, round and reactive to light; extraocular muscles intact Neck: supple; no lymphadenopathy   Heart: regular rate and rhythm; tachycardic  Lungs: inspiratory and expiratory wheezing and rhonchi throughout Abdomen: soft; nondistended; nontender; no masses or hepatosplenomegaly; bowel sounds present Extremities: No deformity; full range of motion; pulses normal Neurologic: Awake, alert and oriented; motor function intact in all extremities and symmetric; no facial droop  Skin: Warm and dry Psychiatric: Normal mood and affect  ED Course  Procedures (including critical care time)  DIAGNOSTIC STUDIES: Oxygen Saturation is 97% on room air, normal by my interpretation.    COORDINATION OF CARE:  11:37 PM- Discussed treatment plan with patient, and the patient agreed to the plan.   CRITICAL CARE Performed by: Paula Libra L Total critical care time: 30 minutes Critical care  time was exclusive of separately billable procedures and treating other patients. Critical care was necessary to treat or prevent imminent or life-threatening deterioration. Critical care was time spent personally by me on the following activities: development of treatment plan with patient and/or surrogate as well as nursing, discussions with consultants, evaluation of patient's response to treatment, examination of patient, obtaining history from patient or surrogate, ordering and performing treatments and interventions, ordering and review of laboratory studies, ordering and review of radiographic studies, pulse oximetry and re-evaluation of patient's condition.   MDM  Nursing notes and vitals signs, including pulse oximetry, reviewed.  Summary of this visit's results, reviewed by myself:  Labs:  No results found for this or any previous visit (from the past 24 hour(s)).  Imaging Studies: Dg Chest 2 View  04/14/2014   CLINICAL DATA:  Shortness of breath and cough.  History of asthma.  EXAM: CHEST  2 VIEW  COMPARISON:  Chest radiograph performed 08/03/2012  FINDINGS: The lungs are well-aerated and clear. There is no evidence of focal opacification, pleural effusion or pneumothorax.  The heart is normal in size; the mediastinal contour is within normal limits. No acute osseous abnormalities are seen.  IMPRESSION: No acute cardiopulmonary process seen.   Electronically Signed   By: Roanna Raider M.D.   On: 04/14/2014 00:11   12:44 AM Continuous albuterol initiated after initial albuterol and Atrovent neb treatment. Solu-Medrol 125 mg given IV. Air movement is improving though some expiratory wheezes persist. The patient is reporting significant subjective improvement.  1:20 AM Patient now active, laughing, states she's ready to go home. Air movement much improved with minimal persistent expiratory wheezes.  I personally performed the services described in this documentation, which was scribed  in my presence. The recorded information has been reviewed and is accurate.   Hanley Seamen, MD 04/14/14 0121  Carlisle Beers Cassidi Modesitt, MD 04/14/14 4098

## 2014-04-13 NOTE — ED Notes (Signed)
Pt reports has family ember infant that had URI who they cared for several days ago and now she has similar symptoms. Took corracidin  Cough and could with minimal relief

## 2014-04-14 MED ORDER — METHYLPREDNISOLONE 4 MG PO KIT: PACK | ORAL | Status: DC

## 2014-04-14 MED ORDER — AEROCHAMBER PLUS W/MASK MISC
1.0000 | Freq: Once | Status: AC
  Administered 2014-04-14: 1
  Filled 2014-04-14: qty 1

## 2014-04-14 MED ORDER — ALBUTEROL SULFATE HFA 108 (90 BASE) MCG/ACT IN AERS
2.0000 | INHALATION_SPRAY | RESPIRATORY_TRACT | Status: DC | PRN
  Administered 2014-04-14: 2 via RESPIRATORY_TRACT
  Filled 2014-04-14: qty 6.7

## 2014-04-14 NOTE — Discharge Instructions (Signed)

## 2020-03-07 ENCOUNTER — Ambulatory Visit: Payer: BC Managed Care – PPO

## 2020-03-09 ENCOUNTER — Ambulatory Visit (INDEPENDENT_AMBULATORY_CARE_PROVIDER_SITE_OTHER): Payer: BC Managed Care – PPO

## 2020-03-09 ENCOUNTER — Ambulatory Visit
Admission: RE | Admit: 2020-03-09 | Discharge: 2020-03-09 | Disposition: A | Discharge status: Home / Self Care | Payer: BC Managed Care – PPO | Source: Ambulatory Visit | Attending: Emergency Medicine | Admitting: Emergency Medicine

## 2020-03-09 ENCOUNTER — Other Ambulatory Visit: Payer: Self-pay

## 2020-03-09 VITALS — BP 138/92 | HR 115 | Temp 98.6°F | Resp 20

## 2020-03-09 DIAGNOSIS — Z20828 Contact with and (suspected) exposure to other viral communicable diseases: Secondary | ICD-10-CM | POA: Diagnosis not present

## 2020-03-09 DIAGNOSIS — R0602 Shortness of breath: Secondary | ICD-10-CM

## 2020-03-09 DIAGNOSIS — J209 Acute bronchitis, unspecified: Secondary | ICD-10-CM | POA: Diagnosis not present

## 2020-03-09 DIAGNOSIS — Z20822 Contact with and (suspected) exposure to covid-19: Secondary | ICD-10-CM

## 2020-03-09 DIAGNOSIS — J45909 Unspecified asthma, uncomplicated: Secondary | ICD-10-CM

## 2020-03-09 DIAGNOSIS — R0902 Hypoxemia: Secondary | ICD-10-CM

## 2020-03-09 LAB — POC SARS CORONAVIRUS 2 AG -  ED: SARS Coronavirus 2 Ag: NEGATIVE

## 2020-03-09 MED ORDER — CETIRIZINE HCL 10 MG PO TABS: 10.0000 mg | ORAL_TABLET | Freq: Every day | ORAL | 0 refills | Status: DC

## 2020-03-09 MED ORDER — DEXAMETHASONE SODIUM PHOSPHATE 10 MG/ML IJ SOLN
10.0000 mg | Freq: Once | INTRAMUSCULAR | Status: AC
  Administered 2020-03-09: 10 mg via INTRAMUSCULAR

## 2020-03-09 MED ORDER — ALBUTEROL SULFATE HFA 108 (90 BASE) MCG/ACT IN AERS
2.0000 | INHALATION_SPRAY | Freq: Once | RESPIRATORY_TRACT | Status: AC
  Administered 2020-03-09: 2 via RESPIRATORY_TRACT

## 2020-03-09 MED ORDER — PREDNISONE 50 MG PO TABS: ORAL_TABLET | ORAL | 0 refills | Status: DC

## 2020-03-09 NOTE — ED Triage Notes (Signed)
Pt here for increased SOB and cough x 1 week; pt with hx of asthma and was out of meds; pt noted to have low O2 sats of 85% of RA

## 2020-03-09 NOTE — Discharge Instructions (Addendum)
Take prednisone once daily with breakfast. Recommend allergy medication, nasal spray as well. Use albuterol inhaler every 4 hours as needed for chest tightness, difficulty breathing. When you pick up your prescriptions at [facility], ask the pharmacist if they have a pulse oximeter. Goal is for your oxygen to remain above 90%, 94% preferred.  If oxygen dips below this and you are experiencing difficulty breathing despite current medications, you need to go to the emergency room for further management.

## 2020-03-09 NOTE — ED Provider Notes (Signed)
[facility]    CSN: [id number] Arrival date & time: 03/09/20  1310      History   Chief Complaint Chief Complaint  Patient presents with  . Shortness of Breath  . Cough    HPI Bonnie Mills is a 29 y.o. female with history of asthma, migraines presenting for increased dyspnea and cough x1 week.  Patient states she is out of her albuterol inhaler at home: Unable to take.  Denies known sick contacts.  No chest pain, palpitations, lightheadedness or dizziness, vomiting.  Does have audible wheezing at home.  No history of PE, DVT.  No lower extremity edema or claudication, prolonged stasis or injury.  No recent change in medications.   Past Medical History:  Diagnosis Date  . Asthma   . Migraine     There are no problems to display for this patient.   History reviewed. No pertinent surgical history.  OB History   No obstetric history on file.      Home Medications    Prior to Admission medications   Medication Sig Start Date End Date Taking? Authorizing Provider  albuterol (PROVENTIL HFA;VENTOLIN HFA) 108 (90 BASE) MCG/ACT inhaler Inhale 1-2 puffs into the lungs every 6 (six) hours as needed for wheezing. 08/03/12   Trixie Dredge, PA-C  cetirizine (ZYRTEC ALLERGY) 10 MG tablet Take 1 tablet (10 mg total) by mouth daily. 03/09/20   Hall-Potvin, [place], PA-C  chlorpheniramine-HYDROcodone (TUSSIONEX PENNKINETIC ER) 10-8 MG/5ML LQCR Take 5 mLs by mouth every 12 (twelve) hours as needed (cough, pain). 08/03/12   Trixie Dredge, PA-C  guaiFENesin (MUCINEX) 600 MG 12 hr tablet Take 1,200 mg by mouth 2 (two) times daily.    [provider]  ibuprofen (ADVIL,MOTRIN) 200 MG tablet Take 400 mg by mouth every 8 (eight) hours as needed. For headache    [provider]  methylPREDNISolone (MEDROL DOSEPAK) 4 MG tablet Taper dose per package instructions. 04/14/14   Molpus, John, MD  predniSONE (DELTASONE) 50 MG tablet Take 1 tablet every morning with breakfast  03/09/20   Hall-Potvin, [place], PA-C  Pseudoephedrine-APAP-DM (DAYQUIL MULTI-SYMPTOM) 60-650-20 MG/30ML LIQD Take 30 mLs by mouth as needed. Congestion    [provider]    Family History Family History  Problem Relation Age of Onset  . Migraines Mother     Social History Social History   Tobacco Use  . Smoking status: Never Smoker  Substance Use Topics  . Alcohol use: No  . Drug use: No     Allergies   Patient has no known allergies.   Review of Systems As per HPI   Physical Exam Triage Vital Signs ED Triage Vitals  Enc Vitals Group     BP      Pulse      Resp      Temp      Temp src      SpO2      Weight      Height      Head Circumference      Peak Flow      Pain Score      Pain Loc      Pain Edu?      Excl. in GC?    No data found.  Updated Vital Signs BP (!) 138/92 (BP Location: Left Arm)   Pulse (!) 115   Temp 98.6 F (37 C) (Oral)   Resp 20   SpO2 (!) 85%   Visual Acuity Right Eye Distance:  Left Eye Distance:   Bilateral Distance:    Right Eye Near:   Left Eye Near:    Bilateral Near:     Physical Exam Constitutional:      General: She is not in acute distress.    Appearance: She is well-developed and normal weight. She is not toxic-appearing.  HENT:     Head: Normocephalic and atraumatic.  Eyes:     General: No scleral icterus.    Pupils: Pupils are equal, round, and reactive to light.  Cardiovascular:     Rate and Rhythm: Regular rhythm. Tachycardia present.  Pulmonary:     Effort: Pulmonary effort is normal. No tachypnea, accessory muscle usage or respiratory distress.     Breath sounds: Wheezing and rhonchi present. No decreased breath sounds.     Comments: Good air entry bilaterally without prolonged expiratory phase Musculoskeletal:     Right lower leg: No tenderness. No edema.     Left lower leg: No tenderness. No edema.  Skin:    Coloration: Skin is not jaundiced or pale.  Neurological:     Mental  Status: She is alert and oriented to person, place, and time.      UC Treatments / Results  Labs (all labs ordered are listed, but only abnormal results are displayed) Labs Reviewed  POC SARS CORONAVIRUS 2 AG -  ED - Normal  NOVEL CORONAVIRUS, NAA    EKG   Radiology DG Chest 2 View  Result Date: 03/09/2020 CLINICAL DATA:  Hypoxia.  History of asthma.  COVID test pending. EXAM: CHEST - 2 VIEW COMPARISON:  None. FINDINGS: The heart size and mediastinal contours are within normal limits. Both lungs are clear. The visualized skeletal structures are unremarkable. IMPRESSION: No active cardiopulmonary disease. Electronically Signed   By: Gerome Sam III M.D   On: 03/09/2020 14:03    Procedures Procedures (including critical care time)  Medications Ordered in UC Medications  dexamethasone (DECADRON) injection 10 mg (10 mg Intramuscular Given 03/09/20 1336)  albuterol (VENTOLIN HFA) 108 (90 Base) MCG/ACT inhaler 2 puff (2 puffs Inhalation Given 03/09/20 1336)    Initial Impression / Assessment and Plan / UC Course  I have reviewed the triage vital signs and the nursing notes.  Pertinent labs & imaging results that were available during my care of the patient were reviewed by me and considered in my medical decision making (see chart for details).     Patient febrile, nontoxic in office today.  Patient is tachycardic with hypoxia.  SPO2 on RA 83-90%.  Rapid Covid negative, PCR pending.  Patient given total of 8 pumps of albuterol inhaler with spacing device, 10 mg Decadron IM which he tolerated well.  Patient endorsing improvement of chest tightness and breathing.  Able to take more deep breaths with SpO2 up to 94% on RA.  Chest x-ray done office, reviewed by me radiology: Negative for active process.  Reviewed findings with patient & wife who joined bedside part way through visit.  Patient electing to try outpatient therapy to avoid hospital visit.  Will send in prednisone,  antihistamine, continue inhaled albuterol every 4 hours, which patient demonstrated technique well.  ER return precautions discussed, patient & wife verbalized understanding and are agreeable to plan. Final Clinical Impressions(s) / UC Diagnoses   Final diagnoses:  SOB (shortness of breath)  Bronchospasm with bronchitis, acute     Discharge Instructions     Take prednisone once daily with breakfast. Recommend allergy medication, nasal spray as well. Use  albuterol inhaler every 4 hours as needed for chest tightness, difficulty breathing. When you pick up your prescriptions at [facility], ask the pharmacist if they have a pulse oximeter. Goal is for your oxygen to remain above 90%, 94% preferred.  If oxygen dips below this and you are experiencing difficulty breathing despite current medications, you need to go to the emergency room for further management.    ED Prescriptions    Medication Sig Dispense Auth. Provider   predniSONE (DELTASONE) 50 MG tablet Take 1 tablet every morning with breakfast 7 tablet Hall-Potvin, [place], PA-C   cetirizine (ZYRTEC ALLERGY) 10 MG tablet Take 1 tablet (10 mg total) by mouth daily. 30 tablet Hall-Potvin, [place], PA-C     PDMP not reviewed this encounter.   Hall-Potvin, [place], [place] 03/09/20 1548

## 2020-03-10 LAB — NOVEL CORONAVIRUS, NAA: SARS-CoV-2, NAA: NOT DETECTED

## 2020-03-10 LAB — SARS-COV-2, NAA 2 DAY TAT

## 2020-11-19 IMAGING — DX DG CHEST 2V
2 series · 2 of 2 positions shown · non-contrast
Comparison: None.

CLINICAL DATA: Hypoxia.  History of asthma.  COVID test pending.

EXAM:
CHEST - 2 VIEW

[chest pa]
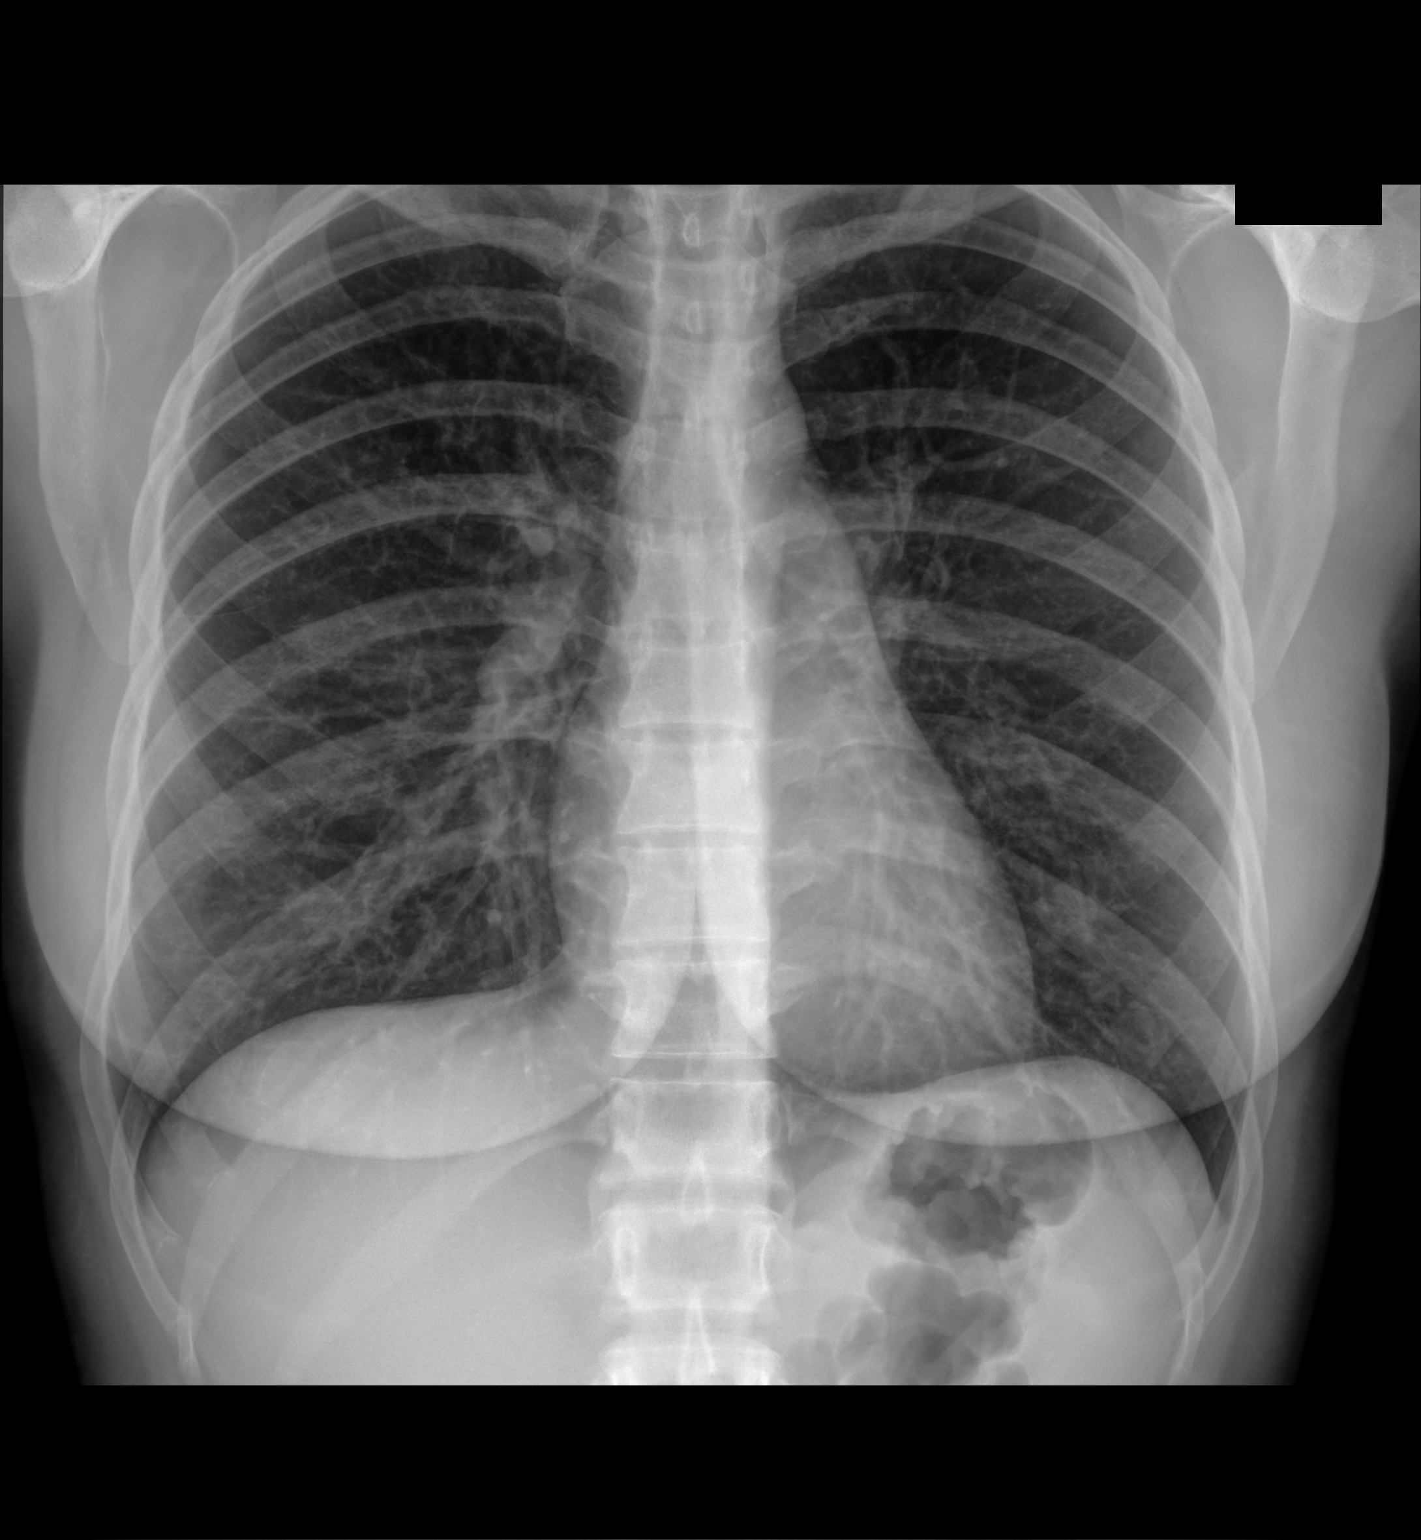

[chest lat]
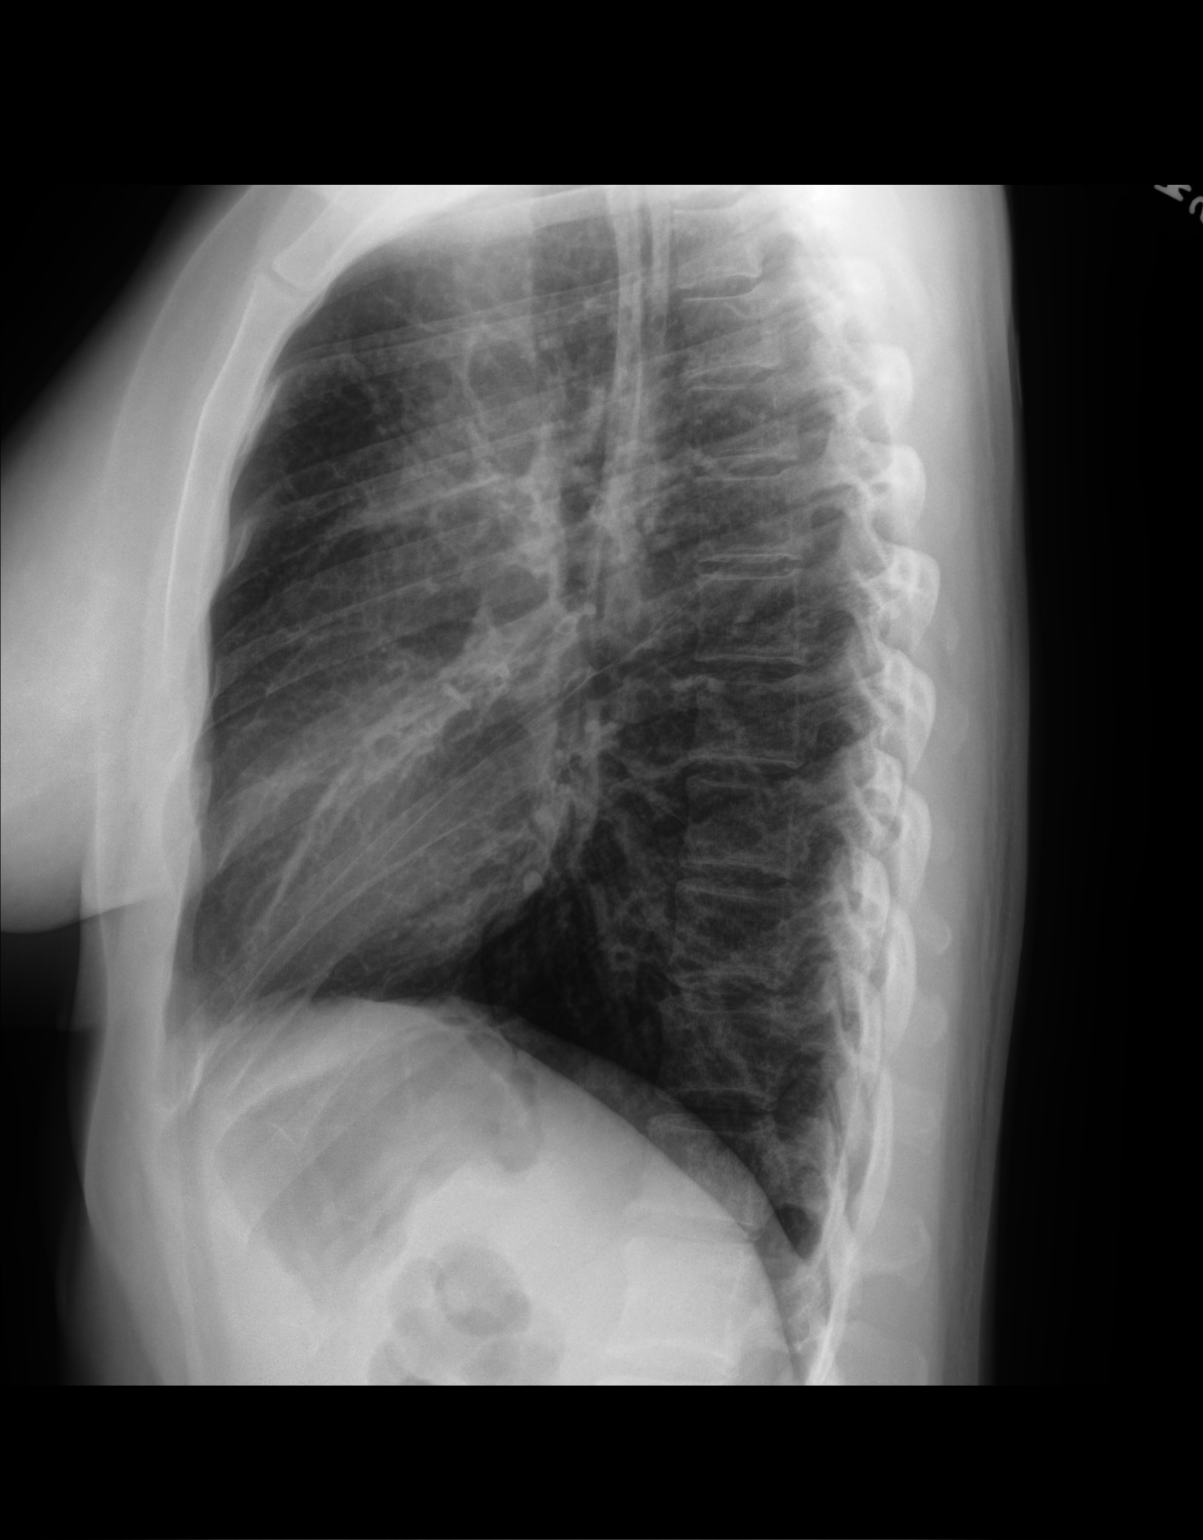

[2 of 2 positions shown; findings below may reference images not displayed]

FINDINGS: The heart size and mediastinal contours are within normal limits.
Both lungs are clear. The visualized skeletal structures are
unremarkable.
IMPRESSION: No active cardiopulmonary disease.

## 2021-11-11 ENCOUNTER — Encounter: Payer: Self-pay | Admitting: Internal Medicine

## 2022-01-12 ENCOUNTER — Ambulatory Visit: Payer: BC Managed Care – PPO | Admitting: Internal Medicine

## 2022-04-01 ENCOUNTER — Encounter: Payer: Self-pay | Admitting: Internal Medicine

## 2022-04-01 ENCOUNTER — Ambulatory Visit (INDEPENDENT_AMBULATORY_CARE_PROVIDER_SITE_OTHER): Payer: No Typology Code available for payment source | Admitting: Internal Medicine

## 2022-04-01 VITALS — BP 106/78 | HR 78 | Temp 98.1°F | Resp 16 | Ht 59.0 in | Wt 112.8 lb

## 2022-04-01 DIAGNOSIS — Z1159 Encounter for screening for other viral diseases: Secondary | ICD-10-CM | POA: Insufficient documentation

## 2022-04-01 DIAGNOSIS — Z23 Encounter for immunization: Secondary | ICD-10-CM | POA: Diagnosis not present

## 2022-04-01 DIAGNOSIS — Z0001 Encounter for general adult medical examination with abnormal findings: Secondary | ICD-10-CM | POA: Insufficient documentation

## 2022-04-01 DIAGNOSIS — Z124 Encounter for screening for malignant neoplasm of cervix: Secondary | ICD-10-CM | POA: Insufficient documentation

## 2022-04-01 DIAGNOSIS — J4530 Mild persistent asthma, uncomplicated: Secondary | ICD-10-CM

## 2022-04-01 LAB — CBC WITH DIFFERENTIAL/PLATELET
Basophils Absolute: 0.1 10*3/uL (ref 0.0–0.1)
Basophils Relative: 1.2 % (ref 0.0–3.0)
Eosinophils Absolute: 1.2 10*3/uL — ABNORMAL HIGH (ref 0.0–0.7)
Eosinophils Relative: 12.4 % — ABNORMAL HIGH (ref 0.0–5.0)
HCT: 38.4 % (ref 36.0–46.0)
Hemoglobin: 13 g/dL (ref 12.0–15.0)
Lymphocytes Relative: 22.5 % (ref 12.0–46.0)
Lymphs Abs: 2.2 10*3/uL (ref 0.7–4.0)
MCHC: 33.9 g/dL (ref 30.0–36.0)
MCV: 84.7 fl (ref 78.0–100.0)
Monocytes Absolute: 1.1 10*3/uL — ABNORMAL HIGH (ref 0.1–1.0)
Monocytes Relative: 11.7 % (ref 3.0–12.0)
Neutro Abs: 5.1 10*3/uL (ref 1.4–7.7)
Neutrophils Relative %: 52.2 % (ref 43.0–77.0)
Platelets: 236 10*3/uL (ref 150.0–400.0)
RBC: 4.53 Mil/uL (ref 3.87–5.11)
RDW: 12.3 % (ref 11.5–15.5)
WBC: 9.8 10*3/uL (ref 4.0–10.5)

## 2022-04-01 LAB — LIPID PANEL
Cholesterol: 118 mg/dL (ref 0–200)
HDL: 49.9 mg/dL (ref >39.00)
LDL Cholesterol: 51 mg/dL (ref 0–99)
NonHDL: 68.11
Total CHOL/HDL Ratio: 2
Triglycerides: 86 mg/dL (ref 0.0–149.0)
VLDL: 17.2 mg/dL (ref 0.0–40.0)

## 2022-04-01 MED ORDER — FLUTICASONE-SALMETEROL 100-50 MCG/ACT IN AEPB: 1.0000 | INHALATION_SPRAY | Freq: Two times a day (BID) | RESPIRATORY_TRACT | 1 refills | Status: DC

## 2022-04-01 NOTE — Patient Instructions (Signed)

## 2022-04-01 NOTE — Progress Notes (Signed)
Subjective:  Patient ID: Bonnie Mills, female    DOB: [birth year]  Age: 31 y.o. MRN: [id number]  CC: Annual Exam and Asthma   HPI Bonnie Mills presents for a CPX and to establish.  She has a history of asthma previously treated with Symbicort.  Over the last few months she has had a few episodes of chest tightness and has been using a rescue inhaler.  She denies cough, fever, chills, night sweats, or weight loss.  History Bonnie Mills has a past medical history of Asthma and Migraine.   She has no past surgical history on file.   Her family history includes Alcoholism in her father; Asthma in her brother; CAD in her father; COPD in her father; Depression in her brother; Migraines in her mother.She reports that she quit smoking about 7 months ago. Her smoking use included cigarettes. She has been exposed to tobacco smoke. She does not have any smokeless tobacco history on file. She reports that she does not drink alcohol and does not use drugs.  Outpatient Medications Prior to Visit  Medication Sig Dispense Refill   albuterol (PROVENTIL HFA;VENTOLIN HFA) 108 (90 BASE) MCG/ACT inhaler Inhale 1-2 puffs into the lungs every 6 (six) hours as needed for wheezing. 1 Inhaler 0   cetirizine (ZYRTEC ALLERGY) 10 MG tablet Take 1 tablet (10 mg total) by mouth daily. 30 tablet 0   ibuprofen (ADVIL,MOTRIN) 200 MG tablet Take 400 mg by mouth every 8 (eight) hours as needed. For headache     chlorpheniramine-HYDROcodone (TUSSIONEX PENNKINETIC ER) 10-8 MG/5ML LQCR Take 5 mLs by mouth every 12 (twelve) hours as needed (cough, pain). 140 mL 0   guaiFENesin (MUCINEX) 600 MG 12 hr tablet Take 1,200 mg by mouth 2 (two) times daily.     methylPREDNISolone (MEDROL DOSEPAK) 4 MG tablet Taper dose per package instructions. 21 tablet 0   predniSONE (DELTASONE) 50 MG tablet Take 1 tablet every morning with breakfast 7 tablet 0   Pseudoephedrine-APAP-DM (DAYQUIL MULTI-SYMPTOM) 60-650-20 MG/30ML LIQD Take 30 mLs by mouth  as needed. Congestion     No facility-administered medications prior to visit.    ROS Review of Systems  Constitutional: Negative.  Negative for chills, fatigue and fever.  HENT: Negative.    Eyes: Negative.   Respiratory:  Positive for chest tightness and wheezing. Negative for cough and shortness of breath.   Cardiovascular:  Negative for chest pain, palpitations and leg swelling.  Gastrointestinal:  Negative for abdominal pain, constipation, diarrhea, nausea and vomiting.  Endocrine: Negative.   Genitourinary: Negative.  Negative for difficulty urinating.  Musculoskeletal: Negative.  Negative for arthralgias and joint swelling.  Skin: Negative.   Neurological:  Negative for dizziness, facial asymmetry and weakness.  Hematological:  Negative for adenopathy. Does not bruise/bleed easily.  Psychiatric/Behavioral: Negative.      Objective:  BP 106/78   Pulse 78   Temp 98.1 F (36.7 C) (Oral)   Resp 16   Ht 4\' 11"  (1.499 m)   Wt 112 lb 12.8 oz (51.2 kg)   LMP 03/21/2022 (Approximate)   SpO2 98%   BMI 22.78 kg/m   Physical Exam Vitals reviewed.  HENT:     Nose: Nose normal.     Mouth/Throat:     Mouth: Mucous membranes are moist.  Eyes:     General: No scleral icterus.    Conjunctiva/sclera: Conjunctivae normal.  Cardiovascular:     Rate and Rhythm: Normal rate and regular rhythm.     Heart sounds: No murmur  heard. Pulmonary:     Effort: Pulmonary effort is normal.     Breath sounds: No stridor. No wheezing, rhonchi or rales.  Abdominal:     General: Abdomen is flat.     Palpations: There is no mass.     Tenderness: There is no abdominal tenderness. There is no guarding.     Hernia: No hernia is present.  Musculoskeletal:        General: Normal range of motion.     Cervical back: Neck supple.     Right lower leg: No edema.     Left lower leg: No edema.  Lymphadenopathy:     Cervical: No cervical adenopathy.  Skin:    General: Skin is warm and dry.   Neurological:     General: No focal deficit present.     Mental Status: She is alert.  Psychiatric:        Mood and Affect: Mood normal.        Behavior: Behavior normal.     Lab Results  Component Value Date   WBC 9.8 04/01/2022   HGB 13.0 04/01/2022   HCT 38.4 04/01/2022   PLT 236.0 04/01/2022   GLUCOSE 100 (H) 11/07/2011   CHOL 118 04/01/2022   TRIG 86.0 04/01/2022   HDL 49.90 04/01/2022   LDLCALC 51 04/01/2022   NA 138 11/07/2011   K 4.1 11/07/2011   CL 103 11/07/2011   CREATININE 0.90 11/07/2011   BUN 6 11/07/2011     Assessment & Plan:   Nanci was seen today for annual exam and asthma.  Diagnoses and all orders for this visit:  Encounter for general adult medical examination with abnormal findings- Exam completed, labs reviewed, vaccines reviewed and updated, cancer screenings addressed, patient education was given. -     Lipid panel; Future -     Hepatitis C antibody; Future -     HIV Antibody (routine testing w rflx); Future -     HIV Antibody (routine testing w rflx) -     Hepatitis C antibody -     Lipid panel  Mild persistent asthma without complication- I have asked her to use an ICS/LABA in addition to the SABA. -     CBC with Differential/Platelet; Future -     fluticasone-salmeterol (ADVAIR DISKUS) 100-50 MCG/ACT AEPB; Inhale 1 puff into the lungs 2 (two) times daily. -     CBC with Differential/Platelet  Need for hepatitis C screening test -     Hepatitis C antibody; Future -     Hepatitis C antibody  Cervical cancer screening -     Ambulatory referral to Gynecology  Need for pneumococcal 20-valent conjugate vaccination -     Pneumococcal conjugate vaccine 20-valent (Prevnar 20)   I have discontinued Myrene Buddy Deshazer's guaiFENesin, Pseudoephedrine-APAP-DM, chlorpheniramine-HYDROcodone, methylPREDNISolone, and predniSONE. I am also having her start on fluticasone-salmeterol. Additionally, I am having her maintain her ibuprofen, albuterol, and  cetirizine.  Meds ordered this encounter  Medications   fluticasone-salmeterol (ADVAIR DISKUS) 100-50 MCG/ACT AEPB    Sig: Inhale 1 puff into the lungs 2 (two) times daily.    Dispense:  180 each    Refill:  1     Follow-up: Return in about 6 months (around 10/02/2022).  Sanda Linger, MD

## 2022-04-02 ENCOUNTER — Encounter: Payer: Self-pay | Admitting: Internal Medicine

## 2022-04-02 LAB — HEPATITIS C ANTIBODY: Hepatitis C Ab: NONREACTIVE

## 2022-04-02 LAB — HIV ANTIBODY (ROUTINE TESTING W REFLEX): HIV 1&2 Ab, 4th Generation: NONREACTIVE

## 2022-04-03 ENCOUNTER — Encounter: Payer: Self-pay | Admitting: Internal Medicine

## 2022-04-05 MED ORDER — ALBUTEROL SULFATE HFA 108 (90 BASE) MCG/ACT IN AERS: 1.0000 | INHALATION_SPRAY | Freq: Four times a day (QID) | RESPIRATORY_TRACT | 2 refills | Status: DC | PRN

## 2022-04-12 ENCOUNTER — Ambulatory Visit: Payer: BC Managed Care – PPO

## 2022-04-19 ENCOUNTER — Ambulatory Visit: Payer: BC Managed Care – PPO

## 2022-06-08 ENCOUNTER — Telehealth: Payer: BC Managed Care – PPO | Admitting: Physician Assistant

## 2022-06-08 DIAGNOSIS — U071 COVID-19: Secondary | ICD-10-CM

## 2022-06-08 MED ORDER — MOLNUPIRAVIR EUA 200MG CAPSULE: 4.0000 | ORAL_CAPSULE | Freq: Two times a day (BID) | ORAL | 0 refills | Status: AC

## 2022-06-08 NOTE — Progress Notes (Signed)
Virtual Visit Consent   Bonnie Mills, you are scheduled for a virtual visit with a [facility] provider today. Just as with appointments in the office, your consent must be obtained to participate. Your consent will be active for this visit and any virtual visit you may have with one of our providers in the next 365 days. If you have a MyChart account, a copy of this consent can be sent to you electronically.  As this is a virtual visit, video technology does not allow for your provider to perform a traditional examination. This may limit your provider's ability to fully assess your condition. If your provider identifies any concerns that need to be evaluated in person or the need to arrange testing (such as labs, EKG, etc.), we will make arrangements to do so. Although advances in technology are sophisticated, we cannot ensure that it will always work on either your end or our end. If the connection with a video visit is poor, the visit may have to be switched to a telephone visit. With either a video or telephone visit, we are not always able to ensure that we have a secure connection.  By engaging in this virtual visit, you consent to the provision of healthcare and authorize for your insurance to be billed (if applicable) for the services provided during this visit. Depending on your insurance coverage, you may receive a charge related to this service.  I need to obtain your verbal consent now. Are you willing to proceed with your visit today? Bonnie Mills has provided verbal consent on 06/08/2022 for a virtual visit (video or telephone). Leeanne Rio, [place]  Date: 06/08/2022 3:18 PM  Virtual Visit via Video Note   I, Leeanne Rio, connected with  Bonnie Mills  ([id number], [birth year]) on 06/08/22 at  3:15 PM EDT by a video-enabled telemedicine application and verified that I am speaking with the correct person using two identifiers.  Location: Patient: Virtual Visit Location  Patient: Home Provider: Virtual Visit Location Provider: Home Office   I discussed the limitations of evaluation and management by telemedicine and the availability of in person appointments. The patient expressed understanding and agreed to proceed.    History of Present Illness: Bonnie Mills is a 31 y.o. who identifies as a female who was assigned female at birth, and is being seen today for COVID-19.  Endorses symptoms starting yesterday morning with aches, chills, fatigue, fever (Tmax 101.5) and congestion.  Tested positive yesterday.  Her wife is already on treatment for COVID-19.  Patient notes some chest tightness but no overt shortness of breath.  Denies chest pain or GI symptoms.  Has been taking Tylenol for fever.  Does have history of asthma for which she takes Advair daily as directed.  Denies any increased use of her rescue inhaler.Marland Kitchen   HPI: HPI  Problems:  Patient Active Problem List   Diagnosis Date Noted   Encounter for general adult medical examination with abnormal findings 04/01/2022   Mild persistent asthma without complication 23/55/7322   Need for hepatitis C screening test 04/01/2022   Cervical cancer screening 04/01/2022   Need for pneumococcal 20-valent conjugate vaccination 04/01/2022    Allergies: No Known Allergies Medications:  Current Outpatient Medications:    albuterol (VENTOLIN HFA) 108 (90 Base) MCG/ACT inhaler, Inhale 1-2 puffs into the lungs every 6 (six) hours as needed for wheezing., Disp: 1 each, Rfl: 2   fluticasone-salmeterol (ADVAIR DISKUS) 100-50 MCG/ACT AEPB, Inhale 1 puff into the lungs 2 (two)  times daily., Disp: 180 each, Rfl: 1  Observations/Objective: Patient is well-developed, well-nourished in no acute distress.  Resting comfortably at home.  Head is normocephalic, atraumatic.  No labored breathing. Speech is clear and coherent with logical content.  Patient is alert and oriented at baseline.   Assessment and Plan: 1. COVID-19 -  MyChart COVID-19 home monitoring program; Future  Patient with multiple risk factors for complicated course of illness. Discussed risks/benefits of antiviral medications including most common potential ADRs. Patient voiced understanding and would like to proceed with antiviral medication. They are candidate for molnupiravir. Rx sent to pharmacy. Supportive measures, OTC medications and vitamin regimen reviewed.. Patient has been enrolled in a MyChart COVID symptom monitoring program. Anne Shutter reviewed in detail. Strict ER precautions discussed with patient.   Follow Up Instructions: I discussed the assessment and treatment plan with the patient. The patient was provided an opportunity to ask questions and all were answered. The patient agreed with the plan and demonstrated an understanding of the instructions.  A copy of instructions were sent to the patient via MyChart unless otherwise noted below.   The patient was advised to call back or seek an in-person evaluation if the symptoms worsen or if the condition fails to improve as anticipated.  Time:  I spent 10 minutes with the patient via telehealth technology discussing the above problems/concerns.    Piedad Climes, PA-C

## 2022-06-08 NOTE — Patient Instructions (Addendum)
Myrtis Ser, thank you for joining Piedad Climes, PA-C for today's virtual visit.  While this provider is not your primary care provider (PCP), if your PCP is located in our provider database this encounter information will be shared with them immediately following your visit.  Consent: (Patient) Bonnie Mills provided verbal consent for this virtual visit at the beginning of the encounter.  Current Medications:  Current Outpatient Medications:    albuterol (VENTOLIN HFA) 108 (90 Base) MCG/ACT inhaler, Inhale 1-2 puffs into the lungs every 6 (six) hours as needed for wheezing., Disp: 1 each, Rfl: 2   cetirizine (ZYRTEC ALLERGY) 10 MG tablet, Take 1 tablet (10 mg total) by mouth daily., Disp: 30 tablet, Rfl: 0   fluticasone-salmeterol (ADVAIR DISKUS) 100-50 MCG/ACT AEPB, Inhale 1 puff into the lungs 2 (two) times daily., Disp: 180 each, Rfl: 1   ibuprofen (ADVIL,MOTRIN) 200 MG tablet, Take 400 mg by mouth every 8 (eight) hours as needed. For headache, Disp: , Rfl:    Medications ordered in this encounter:  No orders of the defined types were placed in this encounter.   *If you need refills on other medications prior to your next appointment, please contact your pharmacy*  Follow-Up: Call back or seek an in-person evaluation if the symptoms worsen or if the condition fails to improve as anticipated.  Other Instructions Please keep well-hydrated and get plenty of rest. Start a saline nasal rinse to flush out your nasal passages. You can use plain Mucinex to help thin congestion. If you have a humidifier, running in the bedroom at night. I want you to start OTC vitamin D3 1000 units daily, vitamin C 1000 mg daily, and a zinc supplement. Please take prescribed medications as directed.  You have been enrolled in a MyChart symptom monitoring program. Please answer these questions daily so we can keep track of how you are doing.  You were to quarantine for 5 days from onset of your  symptoms.  After day 5, if you have had no fever and you are feeling better, you can end quarantine but need to mask for an additional 5 days. After day 5 if you have a fever or are having significant symptoms, please quarantine for full 10 days.  If you note any worsening of symptoms, any significant shortness of breath or any chest pain, please seek ER evaluation ASAP.  Please do not delay care!  COVID-19: What to Do if You Are Sick If you test positive and are an older adult or someone who is at high risk of getting very sick from COVID-19, treatment may be available. Contact a healthcare provider right away after a positive test to determine if you are eligible, even if your symptoms are mild right now. You can also visit a Test to Treat location and, if eligible, receive a prescription from a provider. Don't delay: Treatment must be started within the first few days to be effective. If you have a fever, cough, or other symptoms, you might have COVID-19. Most people have mild illness and are able to recover at home. If you are sick: Keep track of your symptoms. If you have an emergency warning sign (including trouble breathing), call 911. Steps to help prevent the spread of COVID-19 if you are sick If you are sick with COVID-19 or think you might have COVID-19, follow the steps below to care for yourself and to help protect other people in your home and community. Stay home except to get medical care Stay  home. Most people with COVID-19 have mild illness and can recover at home without medical care. Do not leave your home, except to get medical care. Do not visit public areas and do not go to places where you are unable to wear a mask. Take care of yourself. Get rest and stay hydrated. Take over-the-counter medicines, such as acetaminophen, to help you feel better. Stay in touch with your doctor. Call before you get medical care. Be sure to get care if you have trouble breathing, or have any other  emergency warning signs, or if you think it is an emergency. Avoid public transportation, ride-sharing, or taxis if possible. Get tested If you have symptoms of COVID-19, get tested. While waiting for test results, stay away from others, including staying apart from those living in your household. Get tested as soon as possible after your symptoms start. Treatments may be available for people with COVID-19 who are at risk for becoming very sick. Don't delay: Treatment must be started early to be effective--some treatments must begin within 5 days of your first symptoms. Contact your healthcare provider right away if your test result is positive to determine if you are eligible. Self-tests are one of several options for testing for the virus that causes COVID-19 and may be more convenient than laboratory-based tests and point-of-care tests. Ask your healthcare provider or your local health department if you need help interpreting your test results. You can visit your state, tribal, local, and territorial health department's website to look for the latest local information on testing sites. Separate yourself from other people As much as possible, stay in a specific room and away from other people and pets in your home. If possible, you should use a separate bathroom. If you need to be around other people or animals in or outside of the home, wear a well-fitting mask. Tell your close contacts that they may have been exposed to COVID-19. An infected person can spread COVID-19 starting 48 hours (or 2 days) before the person has any symptoms or tests positive. By letting your close contacts know they may have been exposed to COVID-19, you are helping to protect everyone. See COVID-19 and Animals if you have questions about pets. If you are diagnosed with COVID-19, someone from the health department may call you. Answer the call to slow the spread. Monitor your symptoms Symptoms of COVID-19 include fever,  cough, or other symptoms. Follow care instructions from your healthcare provider and local health department. Your local health authorities may give instructions on checking your symptoms and reporting information. When to seek emergency medical attention Look for emergency warning signs* for COVID-19. If someone is showing any of these signs, seek emergency medical care immediately: Trouble breathing Persistent pain or pressure in the chest New confusion Inability to wake or stay awake Pale, gray, or blue-colored skin, lips, or nail beds, depending on skin tone *This list is not all possible symptoms. Please call your medical provider for any other symptoms that are severe or concerning to you. Call 911 or call ahead to your local emergency facility: Notify the operator that you are seeking care for someone who has or may have COVID-19. Call ahead before visiting your doctor Call ahead. Many medical visits for routine care are being postponed or done by phone or telemedicine. If you have a medical appointment that cannot be postponed, call your doctor's office, and tell them you have or may have COVID-19. This will help the office protect themselves and other  patients. If you are sick, wear a well-fitting mask You should wear a mask if you must be around other people or animals, including pets (even at home). Wear a mask with the best fit, protection, and comfort for you. You don't need to wear the mask if you are alone. If you can't put on a mask (because of trouble breathing, for example), cover your coughs and sneezes in some other way. Try to stay at least 6 feet away from other people. This will help protect the people around you. Masks should not be placed on young children under age 54 years, anyone who has trouble breathing, or anyone who is not able to remove the mask without help. Cover your coughs and sneezes Cover your mouth and nose with a tissue when you cough or sneeze. Throw away  used tissues in a lined trash can. Immediately wash your hands with soap and water for at least 20 seconds. If soap and water are not available, clean your hands with an alcohol-based hand sanitizer that contains at least 60% alcohol. Clean your hands often Wash your hands often with soap and water for at least 20 seconds. This is especially important after blowing your nose, coughing, or sneezing; going to the bathroom; and before eating or preparing food. Use hand sanitizer if soap and water are not available. Use an alcohol-based hand sanitizer with at least 60% alcohol, covering all surfaces of your hands and rubbing them together until they feel dry. Soap and water are the best option, especially if hands are visibly dirty. Avoid touching your eyes, nose, and mouth with unwashed hands. Handwashing Tips Avoid sharing personal household items Do not share dishes, drinking glasses, cups, eating utensils, towels, or bedding with other people in your home. Wash these items thoroughly after using them with soap and water or put in the dishwasher. Clean surfaces in your home regularly Clean and disinfect high-touch surfaces (for example, doorknobs, tables, handles, light switches, and countertops) in your "sick room" and bathroom. In shared spaces, you should clean and disinfect surfaces and items after each use by the person who is ill. If you are sick and cannot clean, a caregiver or other person should only clean and disinfect the area around you (such as your bedroom and bathroom) on an as needed basis. Your caregiver/other person should wait as long as possible (at least several hours) and wear a mask before entering, cleaning, and disinfecting shared spaces that you use. Clean and disinfect areas that may have blood, stool, or body fluids on them. Use household cleaners and disinfectants. Clean visible dirty surfaces with household cleaners containing soap or detergent. Then, use a household  disinfectant. Use a product from [employer] List N: Disinfectants for Coronavirus (COVID-19). Be sure to follow the instructions on the label to ensure safe and effective use of the product. Many products recommend keeping the surface wet with a disinfectant for a certain period of time (look at "contact time" on the product label). You may also need to wear personal protective equipment, such as gloves, depending on the directions on the product label. Immediately after disinfecting, wash your hands with soap and water for 20 seconds. For completed guidance on cleaning and disinfecting your home, visit Complete Disinfection Guidance. Take steps to improve ventilation at home Improve ventilation (air flow) at home to help prevent from spreading COVID-19 to other people in your household. Clear out COVID-19 virus particles in the air by opening windows, using air filters, and turning  on fans in your home. Use this interactive tool to learn how to improve air flow in your home. When you can be around others after being sick with COVID-19 Deciding when you can be around others is different for different situations. Find out when you can safely end home isolation. For any additional questions about your care, contact your healthcare provider or state or local health department. 12/09/2020 Content source: [facility] for Immunization and Respiratory Diseases ([facility]), Division of Viral Diseases This information is not intended to replace advice given to you by your health care provider. Make sure you discuss any questions you have with your health care provider. Document Revised: 01/22/2021 Document Reviewed: 01/22/2021 Elsevier Patient Education  2022 [employer].   If you have been instructed to have an in-person evaluation today at a local Urgent Care facility, please use the link below. It will take you to a list of all of our available [facility], including address, phone number and  hours of operation. Please do not delay care.  [facility]  If you or a family member do not have a primary care provider, use the link below to schedule a visit and establish care. When you choose a [facility] primary care physician or advanced practice provider, you gain a long-term partner in health. Find a Primary Care Provider  Learn more about [facility]'s in-office and virtual care options: [facility]

## 2022-06-10 ENCOUNTER — Encounter (INDEPENDENT_AMBULATORY_CARE_PROVIDER_SITE_OTHER): Payer: Self-pay

## 2022-06-10 ENCOUNTER — Ambulatory Visit: Payer: Self-pay | Admitting: *Deleted

## 2022-06-10 ENCOUNTER — Telehealth: Payer: Self-pay

## 2022-06-10 ENCOUNTER — Telehealth: Payer: Self-pay | Admitting: Internal Medicine

## 2022-06-10 NOTE — Telephone Encounter (Signed)
Patient is experiencing diarhhea and feels that it is coming from the anti viral she was prescribed.  Patient wants to know if she should continue with the anti viral and if she should be prescribed paxlovid instead.

## 2022-06-10 NOTE — Telephone Encounter (Signed)
Pt is currently on molnupiravir EUA (LAGEVRIO) 200 mg CAPS capsule but requesting Paxlovid. Please advise.

## 2022-06-10 NOTE — Telephone Encounter (Signed)
  Chief Complaint: Diarrhea Symptoms: 3 xs today, loose watery at times. Mild abdominal cramping "When going to bathroom." Called as pt answered worsening diarrhea on Covid Questionnaire.  Frequency:Today Pertinent Negatives: Patient denies  Disposition: [] ED /[] Urgent Care (no appt availability in office) / [] Appointment(In office/virtual)/ []  [facility]/ [] Home Care/ [] Refused Recommended Disposition /[] [facility]/ [x]  Follow-up with PCP Additional Notes: Pt questioning if she should take alternative anti-viral, started on molnupiravir on 19th. Advised to alert PCP. Care advise provided, pt verbalizes understanding.  Reason for Disposition  [1] MILD diarrhea AND [2] taking antibiotics  Answer Assessment - Initial Assessment Questions 1. DIARRHEA SEVERITY: "How bad is the diarrhea?" "How many more stools have you had in the past 24 hours than normal?"    - NO DIARRHEA (SCALE 0)   - MILD (SCALE 1-3): Few loose or mushy BMs; increase of 1-3 stools over normal daily number of stools; mild increase in ostomy output.   -  MODERATE (SCALE 4-7): Increase of 4-6 stools daily over normal; moderate increase in ostomy output.   -  SEVERE (SCALE 8-10; OR "WORST POSSIBLE"): Increase of 7 or more stools daily over normal; moderate increase in ostomy output; incontinence.     Mild 2. ONSET: "When did the diarrhea begin?"      Today 3. BM CONSISTENCY: "How loose or watery is the diarrhea?"     Some form 4. VOMITING: "Are you also vomiting?" If Yes, ask: "How many times in the past 24 hours?"      3 times 5. ABDOMEN PAIN: "Are you having any abdomen pain?" If Yes, ask: "What does it feel like?" (e.g., crampy, dull, intermittent, constant)      Cramping when going to BR 6. ABDOMEN PAIN SEVERITY: If present, ask: "How bad is the pain?"  (e.g., Scale 1-10; mild, moderate, or severe)   - MILD (1-3): doesn't interfere with normal activities, abdomen soft and not tender to touch    -  MODERATE (4-7): interferes with normal activities or awakens from sleep, abdomen tender to touch    - SEVERE (8-10): excruciating pain, doubled over, unable to do any normal activities       Mild 7. ORAL INTAKE: If vomiting, "Have you been able to drink liquids?" "How much liquids have you had in the past 24 hours?"      8. HYDRATION: "Any signs of dehydration?" (e.g., dry mouth [not just dry lips], too weak to stand, dizziness, new weight loss) "When did you last urinate?"     No. "Weak" 9. EXPOSURE: "Have you traveled to a foreign country recently?" "Have you been exposed to anyone with diarrhea?" "Could you have eaten any food that was spoiled?"      10. ANTIBIOTIC USE: "Are you taking antibiotics now or have you taken antibiotics in the past 2 months?"    Molnupiravir started on 19th. 11. OTHER SYMPTOMS: "Do you have any other symptoms?" (e.g., fever, blood in stool)  Protocols used: [facility]

## 2022-06-10 NOTE — Telephone Encounter (Signed)
Called pt, LVM.   

## 2022-06-10 NOTE — Telephone Encounter (Signed)
Called patient in regards to the [place] filled out on my chart. Left vm to call community line back for any concerns.

## 2022-06-14 ENCOUNTER — Telehealth: Payer: Self-pay | Admitting: Internal Medicine

## 2022-06-14 NOTE — Telephone Encounter (Signed)
Patient came by and dropped off FMLA paperwork for provider to fill out. Call back at [phone] when done. I placed in providers box up front. Its just do as soon as possible. It was for when she was out with covid

## 2022-06-15 DIAGNOSIS — Z0289 Encounter for other administrative examinations: Secondary | ICD-10-CM

## 2022-06-15 NOTE — Telephone Encounter (Signed)
LVM to inquire about the dates she was out of work.

## 2022-06-15 NOTE — Telephone Encounter (Signed)
FMLA has been completed and given to PCP to sign.

## 2022-06-15 NOTE — Telephone Encounter (Addendum)
She had covid last week and her job requires that she fill out FMLA if missing more than 3 days. Bonnie Mills has returned back to work as of yesterday.

## 2022-06-15 NOTE — Telephone Encounter (Signed)
Patient was out from the 19th of September till 06/14/2022.

## 2022-06-15 NOTE — Telephone Encounter (Signed)
LVM on clarification on what FMLA is needed for

## 2022-06-15 NOTE — Telephone Encounter (Signed)
FMLA has been faxed back  Copy sent to charge Copy sent to pt Original filed with CMA

## 2022-06-22 ENCOUNTER — Encounter (HOSPITAL_BASED_OUTPATIENT_CLINIC_OR_DEPARTMENT_OTHER): Payer: BC Managed Care – PPO | Admitting: Advanced Practice Midwife

## 2022-11-06 ENCOUNTER — Emergency Department (HOSPITAL_BASED_OUTPATIENT_CLINIC_OR_DEPARTMENT_OTHER)
Admission: EM | Admit: 2022-11-06 | Discharge: 2022-11-06 | Disposition: A | Discharge status: Home / Self Care | Payer: No Typology Code available for payment source | Attending: Emergency Medicine | Admitting: Emergency Medicine

## 2022-11-06 ENCOUNTER — Other Ambulatory Visit: Payer: Self-pay

## 2022-11-06 ENCOUNTER — Encounter: Payer: Self-pay | Admitting: Emergency Medicine

## 2022-11-06 ENCOUNTER — Ambulatory Visit
Admission: EM | Admit: 2022-11-06 | Discharge: 2022-11-06 | Disposition: A | Discharge status: Home / Self Care | Payer: No Typology Code available for payment source | Attending: Physician Assistant | Admitting: Physician Assistant

## 2022-11-06 ENCOUNTER — Encounter (HOSPITAL_BASED_OUTPATIENT_CLINIC_OR_DEPARTMENT_OTHER): Payer: Self-pay

## 2022-11-06 DIAGNOSIS — R140 Abdominal distension (gaseous): Secondary | ICD-10-CM

## 2022-11-06 DIAGNOSIS — R822 Biliuria: Secondary | ICD-10-CM | POA: Diagnosis not present

## 2022-11-06 DIAGNOSIS — R1084 Generalized abdominal pain: Secondary | ICD-10-CM | POA: Diagnosis present

## 2022-11-06 LAB — POCT URINALYSIS DIP (MANUAL ENTRY)
Blood, UA: NEGATIVE
Glucose, UA: NEGATIVE mg/dL
Nitrite, UA: NEGATIVE
Spec Grav, UA: >=1.03 — AB (ref 1.010–1.025)
Urobilinogen, UA: 2 E.U./dL — AB
pH, UA: 6 (ref 5.0–8.0)

## 2022-11-06 LAB — COMPREHENSIVE METABOLIC PANEL
ALT: 44 U/L (ref 0–44)
AST: 22 U/L (ref 15–41)
Albumin: 4.2 g/dL (ref 3.5–5.0)
Alkaline Phosphatase: 67 U/L (ref 38–126)
Anion gap: 8 (ref 5–15)
BUN: 12 mg/dL (ref 6–20)
CO2: 27 mmol/L (ref 22–32)
Calcium: 8.7 mg/dL — ABNORMAL LOW (ref 8.9–10.3)
Chloride: 102 mmol/L (ref 98–111)
Creatinine, Ser: 0.7 mg/dL (ref 0.44–1.00)
GFR, Estimated: >60 mL/min (ref >60)
Glucose, Bld: 90 mg/dL (ref 70–99)
Potassium: 3.3 mmol/L — ABNORMAL LOW (ref 3.5–5.1)
Sodium: 137 mmol/L (ref 135–145)
Total Bilirubin: 0.5 mg/dL (ref 0.3–1.2)
Total Protein: 6.9 g/dL (ref 6.5–8.1)

## 2022-11-06 LAB — POCT URINE PREGNANCY: Preg Test, Ur: NEGATIVE

## 2022-11-06 LAB — CBC
HCT: 37.9 % (ref 36.0–46.0)
Hemoglobin: 13.3 g/dL (ref 12.0–15.0)
MCH: 29.1 pg (ref 26.0–34.0)
MCHC: 35.1 g/dL (ref 30.0–36.0)
MCV: 82.9 fL (ref 80.0–100.0)
Platelets: 250 10*3/uL (ref 150–400)
RBC: 4.57 MIL/uL (ref 3.87–5.11)
RDW: 11.9 % (ref 11.5–15.5)
WBC: 6.5 10*3/uL (ref 4.0–10.5)
nRBC: 0 % (ref 0.0–0.2)

## 2022-11-06 LAB — LIPASE, BLOOD: Lipase: 17 U/L (ref 11–51)

## 2022-11-06 NOTE — ED Notes (Signed)
Patient is being discharged from the Urgent Care and sent to the Emergency Department via POV . Per RM, patient is in need of higher level of care due to abd pain. Patient is aware and verbalizes understanding of plan of care.  Vitals:   11/06/22 0901  BP: (!) 153/87  Pulse: 83  Resp: 18  Temp: 98.5 F (36.9 C)  SpO2: 98%

## 2022-11-06 NOTE — ED Notes (Signed)
Pt  has a ua and pregnancy in the system.

## 2022-11-06 NOTE — ED Triage Notes (Signed)
Pt sts fever and body aches last week now resolved; pt sts noted some yellow urine and abd bloating; pt sts possible vaginal bleeding but denies at present

## 2022-11-06 NOTE — ED Notes (Signed)
Dc instructions reviewed with patient. Patient voiced understanding. Dc with belongings.  °

## 2022-11-06 NOTE — ED Provider Notes (Signed)
[facility]    CSN: [id number] Arrival date & time: 11/06/22  0801      History   Chief Complaint Chief Complaint  Patient presents with   Generalized Body Aches    HPI Bonnie Mills is a 32 y.o. female.   Patient here today for evaluation of yellowish urine, abdominal bloating that is started after having fever and bodyaches last week.  She states that fever and bodyaches have resolved but abdominal symptoms seem to be worsening.  She reports questionable vaginal bleeding but this is also resolved.  She does admit to decreased appetite and less solid p.o. intake.  The history is provided by the patient.    Past Medical History:  Diagnosis Date   Asthma    Migraine     Patient Active Problem List   Diagnosis Date Noted   Encounter for general adult medical examination with abnormal findings 04/01/2022   Mild persistent asthma without complication [id number]   Need for hepatitis C screening test 04/01/2022   Cervical cancer screening 04/01/2022   Need for pneumococcal 20-valent conjugate vaccination 04/01/2022    History reviewed. No pertinent surgical history.  OB History   No obstetric history on file.      Home Medications    Prior to Admission medications   Medication Sig Start Date End Date Taking? Authorizing Provider  albuterol (VENTOLIN HFA) 108 (90 Base) MCG/ACT inhaler Inhale 1-2 puffs into the lungs every 6 (six) hours as needed for wheezing. 04/05/22   Janith Lima, MD  fluticasone-salmeterol (ADVAIR DISKUS) 100-50 MCG/ACT AEPB Inhale 1 puff into the lungs 2 (two) times daily. 04/01/22   Janith Lima, MD    Family History Family History  Problem Relation Age of Onset   Migraines Mother    COPD Father    Alcoholism Father    CAD Father    Depression Brother    Asthma Brother     Social History Social History   Tobacco Use   Smoking status: Former    Types: Cigarettes    Quit date: 08/12/2021    Years since quitting:  1.2    Passive exposure: Past  Substance Use Topics   Alcohol use: No   Drug use: No     Allergies   Patient has no known allergies.   Review of Systems Review of Systems  Constitutional:  Positive for appetite change. Negative for chills and fever.  Eyes:  Negative for discharge and redness.  Respiratory:  Negative for shortness of breath.   Gastrointestinal:  Positive for abdominal pain and nausea. Negative for diarrhea and vomiting.  Genitourinary:  Positive for vaginal bleeding.       Discolored urine     Physical Exam Triage Vital Signs ED Triage Vitals  Enc Vitals Group     BP 11/06/22 0901 (!) 153/87     Pulse Rate 11/06/22 0901 83     Resp 11/06/22 0901 18     Temp 11/06/22 0901 98.5 F (36.9 C)     Temp Source 11/06/22 0901 Oral     SpO2 11/06/22 0901 98 %     Weight --      Height --      Head Circumference --      Peak Flow --      Pain Score 11/06/22 0902 0     Pain Loc --      Pain Edu? --      Excl. in [place]? --  No data found.  Updated Vital Signs BP (!) 153/87 (BP Location: Left Arm)   Pulse 83   Temp 98.5 F (36.9 C) (Oral)   Resp 18   SpO2 98%     Physical Exam Vitals and nursing note reviewed.  Constitutional:      General: She is not in acute distress.    Appearance: Normal appearance. She is not ill-appearing.  HENT:     Head: Normocephalic and atraumatic.  Eyes:     Conjunctiva/sclera: Conjunctivae normal.  Cardiovascular:     Rate and Rhythm: Normal rate.  Pulmonary:     Effort: Pulmonary effort is normal. No respiratory distress.  Neurological:     Mental Status: She is alert.  Psychiatric:        Mood and Affect: Mood normal.        Behavior: Behavior normal.        Thought Content: Thought content normal.      UC Treatments / Results  Labs (all labs ordered are listed, but only abnormal results are displayed) Labs Reviewed  POCT URINALYSIS DIP (MANUAL ENTRY) - Abnormal; Notable for the following components:       Result Value   Bilirubin, UA small (*)    Ketones, POC UA large (80) (*)    Spec Grav, UA >=1.030 (*)    Protein Ur, POC trace (*)    Urobilinogen, UA 2.0 (*)    Leukocytes, UA Trace (*)    All other components within normal limits  POCT URINE PREGNANCY    EKG   Radiology No results found.  Procedures Procedures (including critical care time)  Medications Ordered in UC Medications - No data to display  Initial Impression / Assessment and Plan / UC Course  I have reviewed the triage vital signs and the nursing notes.  Pertinent labs & imaging results that were available during my care of the patient were reviewed by me and considered in my medical decision making (see chart for details).    Given UA findings recommended further evaluation in the emergency room for stat labs and possible imaging.  Patient is agreeable to same and will transport herself via POV to local ED.  Final Clinical Impressions(s) / UC Diagnoses   Final diagnoses:  Bilirubin in urine  Abdominal bloating   Discharge Instructions   None    ED Prescriptions   None    PDMP not reviewed this encounter.   Francene Finders, PA-C 11/06/22 [phone]

## 2022-11-06 NOTE — ED Provider Notes (Signed)
[facility] Provider Note   CSN: [id number] Arrival date & time: 11/06/22  0941     History  Chief Complaint  Patient presents with   Abdominal Pain    Bonnie Mills is a 32 y.o. female.  32 yo female with abdominal pain. States on Tuesday felt unwell with body aches, fever, nausea without vomiting, no cough/congestion/sore throat, treated at home with Motrin and Tylenol. Noticed urine was dark yesterday, vaginal dc brown in appearance not on cycle without vaginal itching or skin lesions. Urine was dark yellow at UC, urine with bili and sent to the ER for further work up. Abdominal discomfort to left upper abdomen/generalized. No changes in bowel habits. No sick contacts, no recent travel. Feels like her symptoms are gradually improving but had not resolved which prompted visit to UC today, did not feel like she needed to come to the ER but did due to abnormal UA at [facility].        Home Medications Prior to Admission medications   Medication Sig Start Date End Date Taking? Authorizing Provider  albuterol (VENTOLIN HFA) 108 (90 Base) MCG/ACT inhaler Inhale 1-2 puffs into the lungs every 6 (six) hours as needed for wheezing. 04/05/22  Yes Janith Lima, MD  fluticasone-salmeterol (ADVAIR DISKUS) 100-50 MCG/ACT AEPB Inhale 1 puff into the lungs 2 (two) times daily. 04/01/22   Janith Lima, MD      Allergies    Patient has no known allergies.    Review of Systems   Review of Systems Negative except as per HPI Physical Exam Updated Vital Signs BP (!) 142/85 (BP Location: Right Arm)   Pulse 85   Temp 98.3 F (36.8 C) (Oral)   Resp 17   Ht 4' 11"$  (1.499 m)   Wt 53.5 kg   SpO2 100%   BMI 23.83 kg/m  Physical Exam Vitals and nursing note reviewed.  Constitutional:      General: She is not in acute distress.    Appearance: She is well-developed. She is not diaphoretic.  HENT:     Head: Normocephalic and atraumatic.  Cardiovascular:      Rate and Rhythm: Normal rate and regular rhythm.     Heart sounds: Normal heart sounds.  Pulmonary:     Effort: Pulmonary effort is normal.     Breath sounds: Normal breath sounds.  Abdominal:     Palpations: Abdomen is soft. There is no hepatomegaly.     Tenderness: There is no abdominal tenderness.  Skin:    General: Skin is warm and dry.     Findings: No erythema or rash.  Neurological:     Mental Status: She is alert and oriented to person, place, and time.  Psychiatric:        Behavior: Behavior normal.     ED Results / Procedures / Treatments   Labs (all labs ordered are listed, but only abnormal results are displayed) Labs Reviewed  COMPREHENSIVE METABOLIC PANEL - Abnormal; Notable for the following components:      Result Value   Potassium 3.3 (*)    Calcium 8.7 (*)    All other components within normal limits  LIPASE, BLOOD  CBC    EKG None  Radiology No results found.  Procedures Procedures    Medications Ordered in ED Medications - No data to display  ED Course/ Medical Decision Making/ A&P  Medical Decision Making Amount and/or Complexity of Data Reviewed Labs: ordered.   32 year old otherwise healthy female presents after visit to urgent care today where she was found to have bilirubin in her urine.  Reports feeling like she had had the flu earlier in the week, treated with Motrin, Tylenol, OTC flu remedy.  Her symptoms had improved although not resolved which prompted her to go to urgent care today.  States that she has a little bit of abdominal discomfort but not significant abdominal pain.  On exam, abdomen is soft and nontender, no organomegaly.  Labs today are reassuring including CMP with normal renal and hepatic function.  CBC with normal WBC, normal platelets.  Urinalysis reviewed from urgent care, was positive for bilirubin, pregnancy test was negative.  Patient feels that her symptoms are improving, not  requiring further workup at this time.  Offered viral swab to check for flu as she feels this is what she has had this week, she declines, this seems reasonable.  As her symptoms are improving with reassuring lab work, plan is for patient to follow-up with PCP next week if symptoms not resolved with agreement to return to ER for worsening or concerning symptoms.  Recommend home to rest, push hydrating fluids.        Final Clinical Impression(s) / ED Diagnoses Final diagnoses:  Generalized abdominal pain    Rx / DC Orders ED Discharge Orders     None         Tacy Learn, PA-C 11/06/22 1133    Fredia Sorrow, MD 11/10/22 1404

## 2022-11-06 NOTE — ED Triage Notes (Signed)
In for eval of abd pain with nausea, dark urine, and vaginal discharge. Reports onset of URI type s/s on Tuesday, those have resolved but the abd complaints remain.

## 2022-11-06 NOTE — Discharge Instructions (Signed)
Home to rest and hydrate. Follow up with your PCP early next week if symptoms have not resolved. Return to the ER at any time for worsening or concerning symptoms.

## 2022-12-10 ENCOUNTER — Encounter: Payer: No Typology Code available for payment source | Admitting: Obstetrics & Gynecology

## 2022-12-15 ENCOUNTER — Encounter: Payer: Self-pay | Admitting: Obstetrics and Gynecology

## 2023-02-12 ENCOUNTER — Telehealth: Payer: No Typology Code available for payment source | Admitting: Family

## 2023-02-12 DIAGNOSIS — K047 Periapical abscess without sinus: Secondary | ICD-10-CM

## 2023-02-12 MED ORDER — AMOXICILLIN-POT CLAVULANATE 875-125 MG PO TABS: 1.0000 | ORAL_TABLET | Freq: Two times a day (BID) | ORAL | 0 refills | Status: DC

## 2023-02-12 NOTE — Progress Notes (Signed)
Virtual Visit Consent   Bonnie Mills, you are scheduled for a virtual visit with a [facility] provider today. Just as with appointments in the office, your consent must be obtained to participate. Your consent will be active for this visit and any virtual visit you may have with one of our providers in the next 365 days. If you have a MyChart account, a copy of this consent can be sent to you electronically.  As this is a virtual visit, video technology does not allow for your provider to perform a traditional examination. This may limit your provider's ability to fully assess your condition. If your provider identifies any concerns that need to be evaluated in person or the need to arrange testing (such as labs, EKG, etc.), we will make arrangements to do so. Although advances in technology are sophisticated, we cannot ensure that it will always work on either your end or our end. If the connection with a video visit is poor, the visit may have to be switched to a telephone visit. With either a video or telephone visit, we are not always able to ensure that we have a secure connection.  By engaging in this virtual visit, you consent to the provision of healthcare and authorize for your insurance to be billed (if applicable) for the services provided during this visit. Depending on your insurance coverage, you may receive a charge related to this service.  I need to obtain your verbal consent now. Are you willing to proceed with your visit today? Analissa Hirzel has provided verbal consent on 02/12/2023 for a virtual visit (video or telephone). Jannifer Rodney, FNP  Date: 02/12/2023 9:09 AM  Virtual Visit via Video Note   I, Jannifer Rodney, connected with  Bonnie Mills  ([id number], 04-Apr-1991) on 02/12/23 at  9:00 AM EDT by a video-enabled telemedicine application and verified that I am speaking with the correct person using two identifiers.  Location: Patient: Virtual Visit Location Patient: Other:  car Provider: Virtual Visit Location Provider: Home Office   I discussed the limitations of evaluation and management by telemedicine and the availability of in person appointments. The patient expressed understanding and agreed to proceed.    History of Present Illness: Bonnie Mills is a 32 y.o. who identifies as a female who was assigned female at birth, and is being seen today for abscess tooth of left lower gum that started yesterday. Reports her pain is a 2 out 10, but yesterday was a 10 out 10. She has been gargling with salt water, using tylenol and motrin. Denies any fever. Reports mild swelling of left face. Pt is able to eat, drink, and talk.    She has an appointment with a dentist Tuesday.    HPI: HPI  Problems:  Patient Active Problem List   Diagnosis Date Noted   Encounter for general adult medical examination with abnormal findings 04/01/2022   Mild persistent asthma without complication 04/01/2022   Need for hepatitis C screening test 04/01/2022   Cervical cancer screening 04/01/2022   Need for pneumococcal 20-valent conjugate vaccination 04/01/2022    Allergies: No Known Allergies Medications:  Current Outpatient Medications:    amoxicillin-clavulanate (AUGMENTIN) 875-125 MG tablet, Take 1 tablet by mouth 2 (two) times daily., Disp: 14 tablet, Rfl: 0   albuterol (VENTOLIN HFA) 108 (90 Base) MCG/ACT inhaler, Inhale 1-2 puffs into the lungs every 6 (six) hours as needed for wheezing., Disp: 1 each, Rfl: 2   fluticasone-salmeterol (ADVAIR DISKUS) 100-50 MCG/ACT AEPB, Inhale 1  puff into the lungs 2 (two) times daily., Disp: 180 each, Rfl: 1  Observations/Objective: Patient is well-developed, well-nourished in no acute distress.  Resting comfortably  at home.  Head is normocephalic, atraumatic.  No labored breathing.  Speech is clear and coherent with logical content.  Patient is alert and oriented at baseline.  Mild swelling of left gum  Assessment and Plan: 1.  Dental abscess - amoxicillin-clavulanate (AUGMENTIN) 875-125 MG tablet; Take 1 tablet by mouth 2 (two) times daily.  Dispense: 14 tablet; Refill: 0  Start Augmentin  Continue to alternate tylenol and motrin  Keep dentist appointment  Continue gargling  Follow up if symptoms worsen or do not improve   Follow Up Instructions: I discussed the assessment and treatment plan with the patient. The patient was provided an opportunity to ask questions and all were answered. The patient agreed with the plan and demonstrated an understanding of the instructions.  A copy of instructions were sent to the patient via MyChart unless otherwise noted below.     The patient was advised to call back or seek an in-person evaluation if the symptoms worsen or if the condition fails to improve as anticipated.  Time:  I spent 6 minutes with the patient via telehealth technology discussing the above problems/concerns.    Jannifer Rodney, FNP

## 2023-02-22 ENCOUNTER — Ambulatory Visit: Payer: No Typology Code available for payment source | Admitting: Internal Medicine

## 2023-03-11 ENCOUNTER — Telehealth: Payer: No Typology Code available for payment source | Admitting: Physician Assistant

## 2023-03-11 DIAGNOSIS — K047 Periapical abscess without sinus: Secondary | ICD-10-CM | POA: Diagnosis not present

## 2023-03-11 DIAGNOSIS — R030 Elevated blood-pressure reading, without diagnosis of hypertension: Secondary | ICD-10-CM | POA: Diagnosis not present

## 2023-03-11 MED ORDER — CLINDAMYCIN HCL 300 MG PO CAPS: 300.0000 mg | ORAL_CAPSULE | Freq: Three times a day (TID) | ORAL | 0 refills | Status: AC

## 2023-03-11 NOTE — Progress Notes (Signed)
Virtual Visit Consent   Bonnie Mills, you are scheduled for a virtual visit with a [facility] provider today. Just as with appointments in the office, your consent must be obtained to participate. Your consent will be active for this visit and any virtual visit you may have with one of our providers in the next 365 days. If you have a MyChart account, a copy of this consent can be sent to you electronically.  As this is a virtual visit, video technology does not allow for your provider to perform a traditional examination. This may limit your provider's ability to fully assess your condition. If your provider identifies any concerns that need to be evaluated in person or the need to arrange testing (such as labs, EKG, etc.), we will make arrangements to do so. Although advances in technology are sophisticated, we cannot ensure that it will always work on either your end or our end. If the connection with a video visit is poor, the visit may have to be switched to a telephone visit. With either a video or telephone visit, we are not always able to ensure that we have a secure connection.  By engaging in this virtual visit, you consent to the provision of healthcare and authorize for your insurance to be billed (if applicable) for the services provided during this visit. Depending on your insurance coverage, you may receive a charge related to this service.  I need to obtain your verbal consent now. Are you willing to proceed with your visit today? Bonnie Mills has provided verbal consent on 03/11/2023 for a virtual visit (video or telephone). Margaretann Loveless, PA-C  Date: 03/11/2023 5:31 PM  Virtual Visit via Video Note   I, Margaretann Loveless, connected with  Bonnie Mills  ([id number], 16-May-1991) on 03/11/23 at  5:30 PM EDT by a video-enabled telemedicine application and verified that I am speaking with the correct person using two identifiers.  Location: Patient: Virtual Visit Location  Patient: Home Provider: Virtual Visit Location Provider: Home Office   I discussed the limitations of evaluation and management by telemedicine and the availability of in person appointments. The patient expressed understanding and agreed to proceed.    History of Present Illness: Bonnie Mills is a 32 y.o. who identifies as a female who was assigned female at birth, and is being seen today for possible dental infection.  HPI: Dental Pain  This is a recurrent problem. The current episode started 1 to 4 weeks ago (recently treated on 02/12/23 with Augmentin x 7 days). The problem occurs constantly. The problem has been gradually worsening. The pain is moderate. Associated symptoms include facial pain and thermal sensitivity. Pertinent negatives include no difficulty swallowing or fever. She has tried acetaminophen, NSAIDs and ice (augmentin) for the symptoms. The treatment provided no relief.  Scheduled to have a root canal tomorrow  Reports an elevated BP of 150/120 today. Denies headache, chest pain, SOB, DOE, edema, dizziness, visual changes.  Problems:  Patient Active Problem List   Diagnosis Date Noted   Encounter for general adult medical examination with abnormal findings 04/01/2022   Mild persistent asthma without complication 04/01/2022   Need for hepatitis C screening test 04/01/2022   Cervical cancer screening 04/01/2022   Need for pneumococcal 20-valent conjugate vaccination 04/01/2022    Allergies: No Known Allergies Medications:  Current Outpatient Medications:    albuterol (VENTOLIN HFA) 108 (90 Base) MCG/ACT inhaler, Inhale 1-2 puffs into the lungs every 6 (six) hours as needed for wheezing.,  Disp: 1 each, Rfl: 2   clindamycin (CLEOCIN) 300 MG capsule, Take 1 capsule (300 mg total) by mouth 3 (three) times daily., Disp: 30 capsule, Rfl: 0   fluticasone-salmeterol (ADVAIR DISKUS) 100-50 MCG/ACT AEPB, Inhale 1 puff into the lungs 2 (two) times daily., Disp: 180 each, Rfl:  1  Observations/Objective: Patient is well-developed, well-nourished in no acute distress.  Resting comfortably at home.  Head is normocephalic, atraumatic.  No labored breathing.  Speech is clear and coherent with logical content.  Patient is alert and oriented at baseline.    Assessment and Plan: 1. Tooth abscess - clindamycin (CLEOCIN) 300 MG capsule; Take 1 capsule (300 mg total) by mouth 3 (three) times daily.  Dispense: 30 capsule; Refill: 0  2. Elevated BP without diagnosis of hypertension  - Suspected recurrent infection as symptoms returned slowly, and not as severe, following recent augmentin  - Clindamycin prescribed - Can use ice on outside jaw/cheek for swelling - Can also take tylenol for pain with other medications - Discussed DenTemp putty that can be used to cover a broken tooth - Schedule a follow with a dentist as soon as possible (Can contact [place] [facility] [facility] if underinsured or uninsured)  - For BP continue to monitor - Will follow up with PCP to discuss if still elevated  - Seek in person evaluation if symptoms fail to improve or if they worsen   Follow Up Instructions: I discussed the assessment and treatment plan with the patient. The patient was provided an opportunity to ask questions and all were answered. The patient agreed with the plan and demonstrated an understanding of the instructions.  A copy of instructions were sent to the patient via MyChart unless otherwise noted below.    The patient was advised to call back or seek an in-person evaluation if the symptoms worsen or if the condition fails to improve as anticipated.  Time:  I spent 10 minutes with the patient via telehealth technology discussing the above problems/concerns.    Margaretann Loveless, PA-C

## 2023-03-11 NOTE — Patient Instructions (Signed)
Myrtis Ser, thank you for joining Margaretann Loveless, PA-C for today's virtual visit.  While this provider is not your primary care provider (PCP), if your PCP is located in our provider database this encounter information will be shared with them immediately following your visit.   A [facility] MyChart account gives you access to today's visit and all your visits, tests, and labs performed at [facility] " click here if you don't have a [facility] MyChart account or go to [email or web address]  Consent: (Patient) Bonnie Mills provided verbal consent for this virtual visit at the beginning of the encounter.  Current Medications:  Current Outpatient Medications:    albuterol (VENTOLIN HFA) 108 (90 Base) MCG/ACT inhaler, Inhale 1-2 puffs into the lungs every 6 (six) hours as needed for wheezing., Disp: 1 each, Rfl: 2   clindamycin (CLEOCIN) 300 MG capsule, Take 1 capsule (300 mg total) by mouth 3 (three) times daily., Disp: 30 capsule, Rfl: 0   fluticasone-salmeterol (ADVAIR DISKUS) 100-50 MCG/ACT AEPB, Inhale 1 puff into the lungs 2 (two) times daily., Disp: 180 each, Rfl: 1   Medications ordered in this encounter:  Meds ordered this encounter  Medications   clindamycin (CLEOCIN) 300 MG capsule    Sig: Take 1 capsule (300 mg total) by mouth 3 (three) times daily.    Dispense:  30 capsule    Refill:  0    Order Specific Question:   Supervising Provider    Answer:   Merrilee Jansky [id number]     *If you need refills on other medications prior to your next appointment, please contact your pharmacy*  Follow-Up: Call back or seek an in-person evaluation if the symptoms worsen or if the condition fails to improve as anticipated.  [facility] [phone]  Other Instructions Dental Abscess  A dental abscess is an infection around a tooth that may involve pain, swelling, and a collection of pus, as well as other symptoms. Treatment is important to  help with symptoms and to prevent the infection from spreading. The general types of dental abscesses are: Pulpal abscess. This abscess may form from the inner part of the tooth (pulp). Periodontal abscess. This abscess may form from the gum. What are the causes? This condition is caused by a bacterial infection in or around the tooth. It may result from: Severe tooth decay (cavities). Trauma to the tooth, such as a broken or chipped tooth. What increases the risk? This condition is more likely to develop in males. It is also more likely to develop in people who: Have cavities. Have severe gum disease. Eat sugary snacks between meals. Use tobacco products. Have diabetes. Have a weakened disease-fighting system (immune system). Do not brush and care for their teeth regularly. What are the signs or symptoms? Mild symptoms of this condition include: Tenderness. Bad breath. Fever. A bitter taste in the mouth. Pain in and around the infected tooth. Moderate symptoms of this condition include: Swollen neck glands. Chills. Pus drainage. Swelling and redness around the infected tooth, in the mouth, or in the face. Severe pain in and around the infected tooth. Severe symptoms of this condition include: Difficulty swallowing. Difficulty opening the mouth. Nausea. Vomiting. How is this diagnosed? This condition is diagnosed based on: Your symptoms and your medical and dental history. An examination of the infected tooth. During the exam, your dental care provider may tap on the infected tooth. You may also need to have X-rays taken of the affected area. How  is this treated? This condition is treated by getting rid of the infection. This may be done with: Antibiotic medicines. These may be used in certain situations. Antibacterial mouth rinse. Incision and drainage. This procedure is done by making an incision in the abscess to drain out the pus. Removing pus is the first priority in  treating an abscess. A root canal. This may be performed to save the tooth. Your dental care provider accesses the visible part of your tooth (crown) with a drill and removes any infected pulp. Then the space is filled and sealed off. Tooth extraction. The tooth is pulled out if it cannot be saved by other treatment. You may also receive treatment for pain, such as: Acetaminophen or NSAIDs. Gels that contain a numbing medicine. An injection to block the pain near your nerve. Follow these instructions at home: Medicines Take over-the-counter and prescription medicines only as told by your dental care provider. If you were prescribed an antibiotic, take it as told by your dental care provider. Do not stop taking the antibiotic even if you start to feel better. If you were prescribed a gel that contains a numbing medicine, use it exactly as told in the directions. Do not use these gels for children who are younger than 30 years of age. Use an antibacterial mouth rinse as told by your dental care provider. General instructions  Gargle with a mixture of salt and water 3-4 times a day or as needed. To make salt water, completely dissolve -1 tsp (3-6 g) of salt in 1 cup (237 mL) of warm water. Eat a soft diet while your abscess is healing. Drink enough fluid to keep your urine pale yellow. Do not apply heat to the outside of your mouth. Do not use any products that contain nicotine or tobacco. These products include cigarettes, chewing tobacco, and vaping devices, such as e-cigarettes. If you need help quitting, ask your dental care provider. Keep all follow-up visits. This is important. How is this prevented?  Excellent dental home care, which includes brushing your teeth every morning and night with fluoride toothpaste. Floss one time each day. Get regularly scheduled dental cleanings. Consider having a dental sealant applied on teeth that have deep grooves to prevent cavities. Drink fluoridated  water regularly. This includes most tap water. Check the label on bottled water to see if it contains fluoride. Reduce or eliminate sugary drinks. Eat healthy meals and snacks. Wear a mouth guard or face shield to protect your teeth while playing sports. Contact a health care provider if: Your pain is worse and is not helped by medicine. You have swelling. You see pus around the tooth. You have a fever or chills. Get help right away if: Your symptoms suddenly get worse. You have a very bad headache. You have problems breathing or swallowing. You have trouble opening your mouth. You have swelling in your neck or around your eye. These symptoms may represent a serious problem that is an emergency. Do not wait to see if the symptoms will go away. Get medical help right away. Call your local emergency services (911 in the [place] Do not drive yourself to the hospital. Summary A dental abscess is a collection of pus in or around a tooth that results from an infection. A dental abscess may result from severe tooth decay, trauma to the tooth, or severe gum disease around a tooth. Symptoms include severe pain, swelling, redness, and drainage of pus in and around the infected tooth. The first  priority in treating a dental abscess is to drain out the pus. Treatment may also involve removing damage inside the tooth (root canal) or extracting the tooth. This information is not intended to replace advice given to you by your health care provider. Make sure you discuss any questions you have with your health care provider. Document Revised: 11/13/2020 Document Reviewed: 11/13/2020 Elsevier Patient Education  2024 Elsevier Inc.    If you have been instructed to have an in-person evaluation today at a local Urgent Care facility, please use the link below. It will take you to a list of all of our available [facility], including address, phone number and hours of operation. Please do not delay  care.  [facility]  If you or a family member do not have a primary care provider, use the link below to schedule a visit and establish care. When you choose a [facility] primary care physician or advanced practice provider, you gain a long-term partner in health. Find a Primary Care Provider  Learn more about [facility]'s in-office and virtual care options: [facility] - Get Care Now

## 2023-04-05 ENCOUNTER — Ambulatory Visit: Payer: No Typology Code available for payment source | Admitting: Internal Medicine

## 2023-04-10 ENCOUNTER — Encounter: Payer: Self-pay | Admitting: Internal Medicine

## 2023-05-29 ENCOUNTER — Telehealth: Payer: No Typology Code available for payment source

## 2023-05-29 ENCOUNTER — Telehealth: Payer: No Typology Code available for payment source | Admitting: Physician Assistant

## 2023-05-29 DIAGNOSIS — J45901 Unspecified asthma with (acute) exacerbation: Secondary | ICD-10-CM

## 2023-05-29 MED ORDER — ALBUTEROL SULFATE HFA 108 (90 BASE) MCG/ACT IN AERS: 2.0000 | INHALATION_SPRAY | Freq: Four times a day (QID) | RESPIRATORY_TRACT | 0 refills | Status: DC | PRN

## 2023-05-29 MED ORDER — PREDNISONE 20 MG PO TABS: 40.0000 mg | ORAL_TABLET | Freq: Every day | ORAL | 0 refills | Status: AC

## 2023-05-29 NOTE — Progress Notes (Signed)
E-Visit for Asthma  Based on what you have shared with me, it looks like you may have a flare up of your asthma.  Asthma is a chronic (ongoing) lung disease which results in airway obstruction, inflammation and hyper-responsiveness.   Asthma symptoms vary from person to person, with common symptoms including nighttime awakening and decreased ability to participate in normal activities as a result of shortness of breath. It is often triggered by changes in weather, changes in the season, changes in air temperature, or inside (home, school, daycare or work) allergens such as animal dander, mold, mildew, woodstoves or cockroaches.   It can also be triggered by hormonal changes, extreme emotion, physical exertion or an upper respiratory tract illness.     It is important to identify the trigger, and then eliminate or avoid the trigger if possible.   If you have been prescribed medications to be taken on a regular basis, it is important to follow the asthma action plan and to follow guidelines to adjust medication in response to increasing symptoms of decreased peak expiratory flow rate  Treatment: I have prescribed: Prednisone 40mg  by mouth per day for 5 - 7 days and have sent in a refill of your albuterol inhaler.    HOME CARE Only take medications as instructed by your medical team. Consider wearing a mask or scarf to improve breathing air temperature have been shown to decrease irritation and decrease exacerbations Get rest. Taking a steamy shower or using a humidifier may help nasal congestion sand ease sore throat pain. You can place a towel over your head and breathe in the steam from hot water coming from a faucet. Using a saline nasal spray works much the same way.  Cough drops, hare candies and sore throat lozenges may ease your cough.  Avoid close contacts especially the very you  and the elderly Cover your mouth if you cough or sneeze Always remember to wash your hands.    GET HELP RIGHT AWAY IF: You develop worsening symptoms; breathlessness at rest, drowsy, confused or agitated, unable to speak in full sentences You have coughing fits You develop a severe headache or visual changes You develop shortness of breath, difficulty breathing or start having chest pain Your symptoms persist after you have completed your treatment plan If your symptoms do not improve within 10 days  MAKE SURE YOU Understand these instructions. Will watch your condition. Will get help right away if you are not doing well or get worse.   Your e-visit answers were reviewed by a board certified advanced clinical practitioner to complete your personal care plan, Depending upon the condition, your plan could have included both over the counter or prescription medications.   Please review your pharmacy choice. Your safety is important to [place]. If you have drug allergies check your prescription carefully.  You can use MyChart to ask questions about today's visit, request a non-urgent  call back, or ask for a work or school excuse for 24 hours related to this e-Visit. If it has been greater than 24 hours you will need to follow up with your provider, or enter a new e-Visit to address those concerns.   You will get an e-mail in the next two days asking about your experience. I hope that your e-visit has been valuable and will speed your recovery. Thank you for using e-visits.   I have spent 5 minutes in review of e-visit questionnaire, review and updating patient chart, medical decision making and response to patient.  Tylene Fantasia Ward, PA-C

## 2023-05-31 ENCOUNTER — Ambulatory Visit: Payer: No Typology Code available for payment source | Admitting: Emergency Medicine

## 2023-06-06 ENCOUNTER — Ambulatory Visit: Payer: No Typology Code available for payment source | Admitting: Emergency Medicine

## 2023-09-09 ENCOUNTER — Encounter: Payer: Self-pay | Admitting: Internal Medicine

## 2023-09-14 ENCOUNTER — Telehealth: Payer: No Typology Code available for payment source | Admitting: Family Medicine

## 2023-09-14 ENCOUNTER — Telehealth: Payer: No Typology Code available for payment source

## 2023-09-14 ENCOUNTER — Telehealth: Payer: No Typology Code available for payment source | Admitting: Physician Assistant

## 2023-09-14 DIAGNOSIS — J45901 Unspecified asthma with (acute) exacerbation: Secondary | ICD-10-CM

## 2023-09-14 MED ORDER — DOXYCYCLINE HYCLATE 100 MG PO TABS: 100.0000 mg | ORAL_TABLET | Freq: Two times a day (BID) | ORAL | 0 refills | Status: AC

## 2023-09-14 MED ORDER — GUAIFENESIN 100 MG/5ML PO LIQD: 5.0000 mL | ORAL | 0 refills | Status: AC | PRN

## 2023-09-14 MED ORDER — GUAIFENESIN 100 MG/5ML PO LIQD: 5.0000 mL | ORAL | 0 refills | Status: DC | PRN

## 2023-09-14 MED ORDER — BUDESONIDE-FORMOTEROL FUMARATE 80-4.5 MCG/ACT IN AERO: 2.0000 | INHALATION_SPRAY | Freq: Two times a day (BID) | RESPIRATORY_TRACT | 0 refills | Status: DC

## 2023-09-14 MED ORDER — DOXYCYCLINE HYCLATE 100 MG PO TABS: 100.0000 mg | ORAL_TABLET | Freq: Two times a day (BID) | ORAL | 0 refills | Status: DC

## 2023-09-14 MED ORDER — FLUTICASONE-SALMETEROL 100-50 MCG/ACT IN AEPB: 1.0000 | INHALATION_SPRAY | Freq: Two times a day (BID) | RESPIRATORY_TRACT | 0 refills | Status: DC

## 2023-09-14 NOTE — Progress Notes (Signed)
Virtual Visit Consent   Bonnie Mills, you are scheduled for a virtual visit with a [facility] provider today. Just as with appointments in the office, your consent must be obtained to participate. Your consent will be active for this visit and any virtual visit you may have with one of our providers in the next 365 days. If you have a MyChart account, a copy of this consent can be sent to you electronically.  As this is a virtual visit, video technology does not allow for your provider to perform a traditional examination. This may limit your provider's ability to fully assess your condition. If your provider identifies any concerns that need to be evaluated in person or the need to arrange testing (such as labs, EKG, etc.), we will make arrangements to do so. Although advances in technology are sophisticated, we cannot ensure that it will always work on either your end or our end. If the connection with a video visit is poor, the visit may have to be switched to a telephone visit. With either a video or telephone visit, we are not always able to ensure that we have a secure connection.  By engaging in this virtual visit, you consent to the provision of healthcare and authorize for your insurance to be billed (if applicable) for the services provided during this visit. Depending on your insurance coverage, you may receive a charge related to this service.  I need to obtain your verbal consent now. Are you willing to proceed with your visit today? Bonnie Mills has provided verbal consent on 09/14/2023 for a virtual visit (video or telephone). Reed Pandy, [place]  Date: 09/14/2023 10:40 AM  Virtual Visit via Video Note   I, Reed Pandy, connected with  Bonnie Mills  ([id number], September 01, 1991) on 09/14/23 at 10:30 AM EST by a video-enabled telemedicine application and verified that I am speaking with the correct person using two identifiers.  Location: Patient: Virtual Visit Location Patient:  Home Provider: Virtual Visit Location Provider: Home Office   I discussed the limitations of evaluation and management by telemedicine and the availability of in person appointments. The patient expressed understanding and agreed to proceed.    History of Present Illness: Bonnie Mills is a 32 y.o. who identifies as a female who was assigned female at birth, and is being seen today for c/o having trouble breathing.  Pt states she has asthma and is having a flare up.  Pt states she is wheezing more and has a cough that has a crackle.  Pt states using inhaler more often.  Pt states using albuterol inhaler every four hours. Pt states she had a fever the day before yesterday.   HPI: HPI  Problems:  Patient Active Problem List   Diagnosis Date Noted   Encounter for general adult medical examination with abnormal findings 04/01/2022   Mild persistent asthma without complication 04/01/2022   Need for hepatitis C screening test 04/01/2022   Cervical cancer screening 04/01/2022   Need for pneumococcal 20-valent conjugate vaccination 04/01/2022    Allergies: No Known Allergies Medications:  Current Outpatient Medications:    albuterol (VENTOLIN HFA) 108 (90 Base) MCG/ACT inhaler, Inhale 2 puffs into the lungs every 6 (six) hours as needed for wheezing or shortness of breath., Disp: 8 g, Rfl: 0   budesonide-formoterol (SYMBICORT) 80-4.5 MCG/ACT inhaler, Inhale 2 puffs into the lungs 2 (two) times daily., Disp: 1 each, Rfl: 0   clindamycin (CLEOCIN) 300 MG capsule, Take 1 capsule (300 mg total) by  mouth 3 (three) times daily., Disp: 30 capsule, Rfl: 0   doxycycline (VIBRA-TABS) 100 MG tablet, Take 1 tablet (100 mg total) by mouth 2 (two) times daily for 7 days., Disp: 14 tablet, Rfl: 0   guaiFENesin (ROBITUSSIN) 100 MG/5ML liquid, Take 5 mLs by mouth every 4 (four) hours as needed for up to 7 days for cough or to loosen phlegm., Disp: 120 mL, Rfl: 0  Observations/Objective: Patient is well-developed,  well-nourished in no acute distress.  Resting comfortably at home.  Head is normocephalic, atraumatic.  No labored breathing.  Speech is clear and coherent with logical content.  Patient is alert and oriented at baseline.    Assessment and Plan: 1. Mild asthma with exacerbation, unspecified whether persistent (Primary) - budesonide-formoterol (SYMBICORT) 80-4.5 MCG/ACT inhaler; Inhale 2 puffs into the lungs 2 (two) times daily.  Dispense: 1 each; Refill: 0 - guaiFENesin (ROBITUSSIN) 100 MG/5ML liquid; Take 5 mLs by mouth every 4 (four) hours as needed for up to 7 days for cough or to loosen phlegm.  Dispense: 120 mL; Refill: 0 - doxycycline (VIBRA-TABS) 100 MG tablet; Take 1 tablet (100 mg total) by mouth 2 (two) times daily for 7 days.  Dispense: 14 tablet; Refill: 0  -Start treatment for an asthma exacerbation  -Pt states unable to take prednisone  -Pt to follow up with PCP or urgent care for worsening symptoms  Follow Up Instructions: I discussed the assessment and treatment plan with the patient. The patient was provided an opportunity to ask questions and all were answered. The patient agreed with the plan and demonstrated an understanding of the instructions.  A copy of instructions were sent to the patient via MyChart unless otherwise noted below.    The patient was advised to call back or seek an in-person evaluation if the symptoms worsen or if the condition fails to improve as anticipated.    Reed Pandy, PA-C

## 2023-09-14 NOTE — Addendum Note (Signed)
Addended by: Margaretann Loveless on: 09/14/2023 06:55 PM   Modules accepted: Orders

## 2023-09-14 NOTE — Patient Instructions (Signed)
Bonnie Mills, thank you for joining Reed Pandy, PA-C for today's virtual visit.  While this provider is not your primary care provider (PCP), if your PCP is located in our provider database this encounter information will be shared with them immediately following your visit.   A [facility] MyChart account gives you access to today's visit and all your visits, tests, and labs performed at [facility] " click here if you don't have a [facility] MyChart account or go to [email or web address]  Consent: (Patient) Bonnie Mills provided verbal consent for this virtual visit at the beginning of the encounter.  Current Medications:  Current Outpatient Medications:    albuterol (VENTOLIN HFA) 108 (90 Base) MCG/ACT inhaler, Inhale 2 puffs into the lungs every 6 (six) hours as needed for wheezing or shortness of breath., Disp: 8 g, Rfl: 0   budesonide-formoterol (SYMBICORT) 80-4.5 MCG/ACT inhaler, Inhale 2 puffs into the lungs 2 (two) times daily., Disp: 1 each, Rfl: 0   clindamycin (CLEOCIN) 300 MG capsule, Take 1 capsule (300 mg total) by mouth 3 (three) times daily., Disp: 30 capsule, Rfl: 0   doxycycline (VIBRA-TABS) 100 MG tablet, Take 1 tablet (100 mg total) by mouth 2 (two) times daily for 7 days., Disp: 14 tablet, Rfl: 0   guaiFENesin (ROBITUSSIN) 100 MG/5ML liquid, Take 5 mLs by mouth every 4 (four) hours as needed for up to 7 days for cough or to loosen phlegm., Disp: 120 mL, Rfl: 0   Medications ordered in this encounter:  Meds ordered this encounter  Medications   budesonide-formoterol (SYMBICORT) 80-4.5 MCG/ACT inhaler    Sig: Inhale 2 puffs into the lungs 2 (two) times daily.    Dispense:  1 each    Refill:  0   guaiFENesin (ROBITUSSIN) 100 MG/5ML liquid    Sig: Take 5 mLs by mouth every 4 (four) hours as needed for up to 7 days for cough or to loosen phlegm.    Dispense:  120 mL    Refill:  0   doxycycline (VIBRA-TABS) 100 MG tablet    Sig: Take 1 tablet (100 mg  total) by mouth 2 (two) times daily for 7 days.    Dispense:  14 tablet    Refill:  0     *If you need refills on other medications prior to your next appointment, please contact your pharmacy*  Follow-Up: Call back or seek an in-person evaluation if the symptoms worsen or if the condition fails to improve as anticipated.  [facility] [phone]  Other Instructions Asthma Attack  Asthma attack, also called asthma flare or acute bronchospasm, is the sudden narrowing and tightening of the lower airways (bronchi) in the lungs, that can make it hard to breathe. The narrowing is caused by inflammation and tightening of the smooth muscle that wraps around the lower airways in the lungs. Asthma attacks may cause coughing, high-pitched whistling sounds when you breathe, most often when you breathe out (wheezing), trouble breathing (shortness of breath), and chest pain. The airways may produce extra mucus caused by the inflammation and irritation. During an asthma attack, it can be difficult to breathe. It is important to get treatment right away. Asthma attacks can range from minor to life-threatening. What are the causes? Possible causes or triggers of this condition include: Household allergens like dust, pet dander, and cockroaches. Mold and pollen from trees or grass. Air pollutants such as household cleaners, aerosol sprays, strong odors, and smoke of any kind. Weather changes and  cold air. Stress or strong emotions such as crying or laughing hard. Exercise or activity that requires a lot of energy. Certain medicines or medical conditions such as: Aspirin or beta-blockers. Infections or inflammatory conditions, such as a flu (influenza), a cold, pneumonia, or inflammation of the nasal membranes (rhinitis). Gastroesophageal reflux disease (GERD). GERD is a condition in which stomach acid backs up into your esophagus and spills into your trachea (windpipe), which can irritate  your airways. What are the signs or symptoms? Symptoms of this condition include: Wheezing. Excessive coughing. This may only happen at night. Chest tightness or pain. Shortness of breath. Difficulty talking in complete sentences. Feeling like you cannot get enough air, no matter how hard you breathe (air hunger). How is this diagnosed? This condition may be diagnosed based on: A physical exam and your medical history. Your symptoms. Tests to check for other causes of your symptoms or other conditions that may have triggered your asthma attack. These tests may include: A chest X-ray. Blood tests. Lung function studies (spirometry) to evaluate the flow of air in your lungs. How is this treated? Treatment for this condition depends on the severity and cause of your asthma attack. For mild attacks, you may receive medicines through a hand-held inhaler (metered dose inhaler or MDI) or through a device that turns liquid medicine into a mist (nebulizer). These medicines include: Quick relief or rescue medicines that quickly relax the airways and lungs. Long-acting medicines that are used daily to prevent (control) your asthma symptoms. For moderate or severe attacks, you may be treated with steroid medicines by mouth or through an IV injection at the hospital. For severe attacks, you may need oxygen therapy or a breathing machine (ventilator). If your asthma attack was caused by an infection from bacteria, you will be given antibiotic medicines. Follow these instructions at home: Medicines Take over-the-counter and prescription medicines only as told by your health care provider. If you were prescribed an antibiotic medicine, take it as told by your health care provider. Do not stop using the antibiotic even if you start to feel better. Tell your doctor if you are pregnant or may be pregnant to make sure your asthma medicine is safe to use during pregnancy. Avoiding triggers  Keep track of  things that trigger your asthma attacks. Avoid exposure to these triggers. Do not use any products that contain nicotine or tobacco. These products include cigarettes, chewing tobacco, and vaping devices, such as e-cigarettes. If you need help quitting, ask your health care provider. When there is a lot of pollen, air pollution, or humidity, keep windows closed and use an air conditioner or go to places with air conditioning. Asthma action plan Work with your health care provider to make a written plan for managing and treating your asthma attacks (asthma action plan). This plan should include: A list of your asthma triggers and how to avoid them. A list of symptoms that you may have during an asthma attack. Information about which medicine to take, when to take the medicine, and how much of the medicine to take. Information to help you understand your peak flow measurements. Daily actions that you can take to control your asthma symptoms. Contact information for your health care providers. If you have an asthma attack, act quickly. Follow the emergency steps on your written asthma action plan. This may prevent you from needing to go to the hospital. Talk to a family member or close friend about your asthma action plan and who  to contact in case you need help. General instructions Avoid excessive exercise or activity until your asthma attack goes away. Stay up to date on all your vaccines, such as flu and pneumonia vaccines. Keep all follow-up visits. This is important. Contact a health care provider if: You have followed your action plan for 1 hour and your peak flow reading is still at 50-79% of your personal best. This is in the yellow zone, which means "caution." You need to use your quick reliever medicine more frequently than normal. Your medicines are causing side effects, such as rash, itching, swelling, or trouble breathing. Your symptoms do not improve after taking medicine. You have  a fever. Get help right away if: Your peak flow reading is less than 50% of your personal best. This is in the red zone, which means "danger." You develop chest pain or discomfort. Your medicines no longer seem to be helping. You are coughing up bloody mucus. You have a fever and your symptoms suddenly get worse. You have trouble swallowing. You feel very tired, and breathing becomes tiring. These symptoms may be an emergency. Get help right away. Call 911. Do not wait to see if the symptoms will go away. Do not drive yourself to the hospital. Summary Asthma attacks are caused by narrowing or tightness in air passages, which causes shortness of breath, coughing, and wheezing. Many things can trigger an asthma attack, such as allergens, weather changes, exercise, strong odors, and smoke of any kind. If you have an asthma attack, act quickly. Follow the emergency steps on your written asthma action plan. Get help right away if you have severe trouble breathing, chest pain, or fever, or if your home medicines are no longer helping with your symptoms. This information is not intended to replace advice given to you by your health care provider. Make sure you discuss any questions you have with your health care provider. Document Revised: 06/28/2021 Document Reviewed: 06/28/2021 Elsevier Patient Education  2024 Elsevier Inc.    If you have been instructed to have an in-person evaluation today at a local Urgent Care facility, please use the link below. It will take you to a list of all of our available [facility], including address, phone number and hours of operation. Please do not delay care.  [facility]  If you or a family member do not have a primary care provider, use the link below to schedule a visit and establish care. When you choose a [facility] primary care physician or advanced practice provider, you gain a long-term partner in health. Find a Primary Care  Provider  Learn more about [facility]'s in-office and virtual care options: [facility] - Get Care Now

## 2023-09-14 NOTE — Progress Notes (Signed)
Needed medications changed to new pharmacy. Had visit earlier today.  Marked no charge.

## 2023-09-14 NOTE — Addendum Note (Signed)
Addended by: Margaretann Loveless on: 09/14/2023 07:05 PM   Modules accepted: Orders

## 2023-11-03 ENCOUNTER — Encounter: Payer: Self-pay | Admitting: Obstetrics & Gynecology

## 2023-11-03 ENCOUNTER — Encounter: Payer: No Typology Code available for payment source | Admitting: Family Medicine

## 2023-11-08 ENCOUNTER — Encounter: Payer: Self-pay | Admitting: Obstetrics and Gynecology

## 2024-01-23 ENCOUNTER — Encounter: Admitting: Obstetrics and Gynecology

## 2024-08-25 ENCOUNTER — Telehealth: Admitting: Nurse Practitioner

## 2024-08-25 DIAGNOSIS — J45901 Unspecified asthma with (acute) exacerbation: Secondary | ICD-10-CM | POA: Diagnosis not present

## 2024-08-25 MED ORDER — FLUTICASONE-SALMETEROL 100-50 MCG/ACT IN AEPB: 1.0000 | INHALATION_SPRAY | Freq: Two times a day (BID) | RESPIRATORY_TRACT | 0 refills | Status: AC

## 2024-08-25 MED ORDER — ALBUTEROL SULFATE HFA 108 (90 BASE) MCG/ACT IN AERS: 2.0000 | INHALATION_SPRAY | Freq: Four times a day (QID) | RESPIRATORY_TRACT | 0 refills | Status: AC | PRN

## 2024-08-25 NOTE — Patient Instructions (Signed)
  Jamee Molt, thank you for joining Haze LELON Servant, NP for today's virtual visit.  While this provider is not your primary care provider (PCP), if your PCP is located in our provider database this encounter information will be shared with them immediately following your visit.   A [facility] MyChart account gives you access to today's visit and all your visits, tests, and labs performed at [facility]  click here if you don't have a [facility] MyChart account or go to [email or web address]  Consent: (Patient) Bonnie Mills provided verbal consent for this virtual visit at the beginning of the encounter.  Current Medications:  Current Outpatient Medications:    albuterol  (VENTOLIN  HFA) 108 (90 Base) MCG/ACT inhaler, Inhale 2 puffs into the lungs every 6 (six) hours as needed for wheezing or shortness of breath., Disp: 8 g, Rfl: 0   clindamycin  (CLEOCIN ) 300 MG capsule, Take 1 capsule (300 mg total) by mouth 3 (three) times daily., Disp: 30 capsule, Rfl: 0   fluticasone -salmeterol (WIXELA INHUB) 100-50 MCG/ACT AEPB, Inhale 1 puff into the lungs 2 (two) times daily., Disp: 60 each, Rfl: 0   Medications ordered in this encounter:  Meds ordered this encounter  Medications   fluticasone -salmeterol (WIXELA INHUB) 100-50 MCG/ACT AEPB    Sig: Inhale 1 puff into the lungs 2 (two) times daily.    Dispense:  60 each    Refill:  0    Supervising Provider:   BLAISE ALEENE KIDD [[id number]]   albuterol  (VENTOLIN  HFA) 108 (90 Base) MCG/ACT inhaler    Sig: Inhale 2 puffs into the lungs every 6 (six) hours as needed for wheezing or shortness of breath.    Dispense:  8 g    Refill:  0    Supervising Provider:   BLAISE ALEENE KIDD [[id number]]     *If you need refills on other medications prior to your next appointment, please contact your pharmacy*  Follow-Up: Call back or seek an in-person evaluation if the symptoms worsen or if the condition fails to improve as anticipated.  [facility] [phone]  Other Instructions   If you have been instructed to have an in-person evaluation today at a local Urgent Care facility, please use the link below. It will take you to a list of all of our available [facility], including address, phone number and hours of operation. Please do not delay care.  [facility]  If you or a family member do not have a primary care provider, use the link below to schedule a visit and establish care. When you choose a [facility] primary care physician or advanced practice provider, you gain a long-term partner in health. Find a Primary Care Provider  Learn more about [facility]'s in-office and virtual care options: [facility] - Get Care Now

## 2024-08-25 NOTE — Progress Notes (Signed)
 Virtual Visit Consent   Bonnie Mills, you are scheduled for a virtual visit with a [facility] provider today. Just as with appointments in the office, your consent must be obtained to participate. Your consent will be active for this visit and any virtual visit you may have with one of our providers in the next 365 days. If you have a MyChart account, a copy of this consent can be sent to you electronically.  As this is a virtual visit, video technology does not allow for your provider to perform a traditional examination. This may limit your provider's ability to fully assess your condition. If your provider identifies any concerns that need to be evaluated in person or the need to arrange testing (such as labs, EKG, etc.), we will make arrangements to do so. Although advances in technology are sophisticated, we cannot ensure that it will always work on either your end or our end. If the connection with a video visit is poor, the visit may have to be switched to a telephone visit. With either a video or telephone visit, we are not always able to ensure that we have a secure connection.  By engaging in this virtual visit, you consent to the provision of healthcare and authorize for your insurance to be billed (if applicable) for the services provided during this visit. Depending on your insurance coverage, you may receive a charge related to this service.  I need to obtain your verbal consent now. Are you willing to proceed with your visit today? Bonnie Mills has provided verbal consent on 08/25/2024 for a virtual visit (video or telephone). Haze LELON Servant, NP  Date: 08/25/2024 6:09 PM   Virtual Visit via Video Note   I, Haze LELON Servant, connected with  Bonnie Mills  ([id number], [birth year]) on 08/25/24 at  6:00 PM EST by a video-enabled telemedicine application and verified that I am speaking with the correct person using two identifiers.  Location: Patient: Virtual Visit Location Patient:  Home Provider: Virtual Visit Location Provider: Home Office   I discussed the limitations of evaluation and management by telemedicine and the availability of in person appointments. The patient expressed understanding and agreed to proceed.    History of Present Illness: Bonnie Mills is a 33 y.o. who identifies as a female who was assigned female at birth, and is being seen today for asthma exacerbation.  Over the past several day [name] has been experiencing shortness of breath, wheezing, and chest congestion. She has a history of asthma and her inhalers are expired.   Problems:  Patient Active Problem List   Diagnosis Date Noted   Encounter for general adult medical examination with abnormal findings 04/01/2022   Mild persistent asthma without complication 04/01/2022   Need for hepatitis C screening test 04/01/2022   Cervical cancer screening 04/01/2022   Need for pneumococcal 20-valent conjugate vaccination 04/01/2022    Allergies: No Known Allergies Medications:  Current Outpatient Medications:    albuterol  (VENTOLIN  HFA) 108 (90 Base) MCG/ACT inhaler, Inhale 2 puffs into the lungs every 6 (six) hours as needed for wheezing or shortness of breath., Disp: 8 g, Rfl: 0   clindamycin  (CLEOCIN ) 300 MG capsule, Take 1 capsule (300 mg total) by mouth 3 (three) times daily., Disp: 30 capsule, Rfl: 0   fluticasone -salmeterol (WIXELA INHUB) 100-50 MCG/ACT AEPB, Inhale 1 puff into the lungs 2 (two) times daily., Disp: 60 each, Rfl: 0  Observations/Objective: Patient is well-developed, well-nourished in no acute distress.  Resting comfortably at home.  Head is normocephalic, atraumatic.  No labored breathing.  Speech is clear and coherent with logical content.  Patient is alert and oriented at baseline.    Assessment and Plan: 1. Mild asthma with exacerbation, unspecified whether persistent - fluticasone -salmeterol (WIXELA INHUB) 100-50 MCG/ACT AEPB; Inhale 1 puff into the lungs 2 (two)  times daily.  Dispense: 60 each; Refill: 0 - albuterol  (VENTOLIN  HFA) 108 (90 Base) MCG/ACT inhaler; Inhale 2 puffs into the lungs every 6 (six) hours as needed for wheezing or shortness of breath.  Dispense: 8 g; Refill: 0    Follow Up Instructions: I discussed the assessment and treatment plan with the patient. The patient was provided an opportunity to ask questions and all were answered. The patient agreed with the plan and demonstrated an understanding of the instructions.  A copy of instructions were sent to the patient via MyChart unless otherwise noted below.    The patient was advised to call back or seek an in-person evaluation if the symptoms worsen or if the condition fails to improve as anticipated.    Raneisha Bress W Adis Sturgill, NP
